# Patient Record
Sex: Female | Born: 1985 | Race: White | Hispanic: No | Marital: Married | Smoking: Never smoker
Health system: Southern US, Community
[De-identification: ages and names within clinical notes are randomized; demographics above are authoritative.]

## PROBLEM LIST (undated history)

## (undated) ENCOUNTER — Emergency Department: Discharge status: Absent without leave | Payer: 59

## (undated) HISTORY — PX: WISDOM TOOTH EXTRACTION: SHX21

---

## 2016-10-09 ENCOUNTER — Encounter: Payer: Self-pay | Admitting: Podiatry

## 2016-10-09 ENCOUNTER — Ambulatory Visit (INDEPENDENT_AMBULATORY_CARE_PROVIDER_SITE_OTHER): Payer: 59 | Admitting: Podiatry

## 2016-10-09 ENCOUNTER — Ambulatory Visit: Payer: 59

## 2016-10-09 VITALS — BP 133/70 | HR 88 | Resp 16

## 2016-10-09 DIAGNOSIS — S9031XA Contusion of right foot, initial encounter: Secondary | ICD-10-CM

## 2016-10-09 DIAGNOSIS — B353 Tinea pedis: Secondary | ICD-10-CM

## 2016-10-09 DIAGNOSIS — L603 Nail dystrophy: Secondary | ICD-10-CM | POA: Diagnosis not present

## 2016-10-09 NOTE — Progress Notes (Signed)
  Subjective:  Patient ID: Allison Jacobson, female    DOB: Jul 28, 1985,  MRN: [id number] HPI Chief Complaint  Patient presents with  . Skin Problem    Skin on right foot is scaly, peeling, has red splotches for years, moved from [place] a year ago and was Rx'd cream there, but didn't help, also toenails are thick and discolored    31 y.o. female presents with the above complaint. She presents today with her daughter chief complaint of red scaly itchy feet and thick mycotic nails. States the nails become thick and brittle about the time the rash came up. She states that she's had skin biopsies in the past that was never told the results was given a cream which did not help and then was told that she would have to come to the office for laser therapy.  She wanted a second opinion.  No past medical history on file. No past surgical history on file.  Current Outpatient Prescriptions:  .  cetirizine (ZYRTEC) 10 MG tablet, TAKE 1 TABLET BY MOUTH EVERY DAY AT NIGHT, Disp: , Rfl: 1 .  predniSONE (DELTASONE) 20 MG tablet, Take 40 mg by mouth daily., Disp: , Rfl: 0  No Known Allergies Review of Systems  Skin: Positive for color change and rash.  All other systems reviewed and are negative.  Objective:   Vitals:   10/09/16 1333  BP: 133/70  Pulse: 88  Resp: 16    General: Well developed, nourished, in no acute distress, alert and oriented x3   Dermatological: Mild erythematous scaly scalloped rash multiple vesicular lesions and mild punctated petechial lesions. Toenails are thick yellow dystrophic brittle possibly mycotic.  Vascular: Dorsalis Pedis artery and Posterior Tibial artery pedal pulses are 2/4 bilateral with immedate capillary fill time. Pedal hair growth present. No varicosities and no lower extremity edema present bilateral.   Neruologic: Grossly intact via light touch bilateral. Vibratory intact via tuning fork bilateral. Protective threshold with Semmes Wienstein monofilament  intact to all pedal sites bilateral. Patellar and Achilles deep tendon reflexes 2+ bilateral. No Babinski or clonus noted bilateral.   Musculoskeletal: No gross boney pedal deformities bilateral. No pain, crepitus, or limitation noted with foot and ankle range of motion bilateral. Muscular strength 5/5 in all groups tested bilateral.  Gait: Unassisted, Nonantalgic.    Radiographs:  None taken  Assessment & Plan:   Assessment: Probable tinea pedis with onychomycosis certain nail dystrophy and dermatitis.    Plan: Debrided nails and sent for pathologic evaluation skin and nails. This only affected the right foot. Follow up with her in 1 month.     Max T. [place], [place]

## 2016-11-08 ENCOUNTER — Ambulatory Visit: Payer: 59 | Admitting: Podiatry

## 2016-11-08 ENCOUNTER — Ambulatory Visit (INDEPENDENT_AMBULATORY_CARE_PROVIDER_SITE_OTHER): Payer: 59 | Admitting: Podiatry

## 2016-11-08 DIAGNOSIS — Z79899 Other long term (current) drug therapy: Secondary | ICD-10-CM | POA: Diagnosis not present

## 2016-11-08 MED ORDER — TERBINAFINE HCL 250 MG PO TABS: 250.0000 mg | ORAL_TABLET | Freq: Every day | ORAL | 0 refills | Status: DC

## 2016-11-08 NOTE — Progress Notes (Signed)
She presents today for follow-up of her Baker results. She states that she's been using eucalyptus and spear meant or oils  which may have helped at least the skin to some degree.  Objective: Pathology report does demonstrate onychomycosis.  Assessment onychomycosis.  Plan: Discussed topical therapy laser therapy and oral therapy. She will start oral therapy at this point. We discussed the pros and cons and use of this medication. She understands that she will have to have 2 sets of blood work.  We requested a profile immediately today and started her on 30 days worth of Lamisil 250 mg tablets 1 by mouth daily.

## 2016-11-09 ENCOUNTER — Telehealth: Payer: Self-pay | Admitting: Podiatry

## 2016-11-09 LAB — HEPATIC FUNCTION PANEL
ALBUMIN: 4.1 g/dL (ref 3.5–5.5)
ALT: 34 IU/L — ABNORMAL HIGH (ref 0–32)
AST: 17 IU/L (ref 0–40)
Alkaline Phosphatase: 77 IU/L (ref 39–117)
Bilirubin, Direct: 0.07 mg/dL (ref 0.00–0.40)
Total Protein: 7.3 g/dL (ref 6.0–8.5)

## 2016-11-09 NOTE — Telephone Encounter (Signed)
I was calling because I have questions about the medication that Dr. Al CorpusHyatt prescribed me yesterday. If you could, please call me back at [phone]. Thank you.

## 2016-11-09 NOTE — Telephone Encounter (Signed)
Left message for patient to call back  

## 2016-11-10 NOTE — Telephone Encounter (Signed)
This is Allison Jacobson calling back from where I missed your call. If you don't mind, please call me back at [phone] in regards to the medication I was prescribed yesterday. Thank you.

## 2016-11-10 NOTE — Telephone Encounter (Signed)
Returned patient call, she was concerned about taking Lamisil due to it being high risk medication.  I explained to her that this was the reason for getting her labs done to check liver functions and making sure they were normal.  I explained that he will continue to check her liver functions throughout course of treatment and if they come back abnormal, she would be taken off the medication.  She verbalized understanding and was thankful for the call back.

## 2016-11-13 ENCOUNTER — Telehealth: Payer: Self-pay | Admitting: *Deleted

## 2016-11-13 NOTE — Telephone Encounter (Signed)
-----   Message from Elinor ParkinsonMax T Hyatt, [place] sent at 11/11/2016  7:52 AM EDT ----- Blood work looks good. Follow-up in one month.

## 2016-11-13 NOTE — Telephone Encounter (Signed)
I informed pt of Dr. Geryl RankinsHyatt's review of results and orders. Pt asked if the appt she has scheduled 12/11/2016 is okay and I told her that was fine, the medication stated in he system for at least 2 weeks if she had to get labs.

## 2016-12-11 ENCOUNTER — Encounter: Payer: Self-pay | Admitting: Podiatry

## 2016-12-11 ENCOUNTER — Ambulatory Visit (INDEPENDENT_AMBULATORY_CARE_PROVIDER_SITE_OTHER): Payer: 59 | Admitting: Podiatry

## 2016-12-11 DIAGNOSIS — L603 Nail dystrophy: Secondary | ICD-10-CM

## 2016-12-11 DIAGNOSIS — Z79899 Other long term (current) drug therapy: Secondary | ICD-10-CM | POA: Diagnosis not present

## 2016-12-11 MED ORDER — TERBINAFINE HCL 250 MG PO TABS: 250.0000 mg | ORAL_TABLET | Freq: Every day | ORAL | 0 refills | Status: DC

## 2016-12-11 NOTE — Progress Notes (Signed)
She presents today after having taken her first 30 days of Lamisil. She denies fever chills rash or itching. Initially developed some nauseousness and some vomiting. She also goes on to say that she has some headaches recently with the nauseousness has ended. She feels that the headaches may be either the medication or from stress of the holidays.  Objective: Vital signs are stable alert and oriented 3. Complete resolution of tinea pedis. She already has some nail changes and that the nails appear to be clear more proximally.  Assessment: Resolution of tinea pedis onychomycosis resolving with the use of long-term therapy with Lamisil.  Plan: We are requesting a liver profile today. Should this come back abnormal we will notify her immediately. Otherwise she is to go ahead and have her prescription for 90 days filled and I will follow-up with her in 4 months.

## 2016-12-12 ENCOUNTER — Telehealth: Payer: Self-pay | Admitting: *Deleted

## 2016-12-12 LAB — HEPATIC FUNCTION PANEL
ALK PHOS: 72 IU/L (ref 39–117)
ALT: 23 IU/L (ref 0–32)
AST: 12 IU/L (ref 0–40)
Albumin: 3.9 g/dL (ref 3.5–5.5)
Bilirubin Total: 0.2 mg/dL (ref 0.0–1.2)
Bilirubin, Direct: 0.06 mg/dL (ref 0.00–0.40)
TOTAL PROTEIN: 6.7 g/dL (ref 6.0–8.5)

## 2016-12-12 NOTE — Telephone Encounter (Addendum)
-----   Message from Allison ParkinsonMax T Jacobson, [place] sent at 12/12/2016  7:06 AM EST ----- Blood  Work looks perfect and may continue with medication. I informed pt of Dr. Geryl RankinsHyatt's review of results and orders.

## 2017-02-12 ENCOUNTER — Encounter: Payer: Self-pay | Admitting: Internal Medicine

## 2017-02-12 ENCOUNTER — Ambulatory Visit (INDEPENDENT_AMBULATORY_CARE_PROVIDER_SITE_OTHER): Payer: 59 | Admitting: Internal Medicine

## 2017-02-12 VITALS — BP 130/92 | HR 101 | Temp 97.8°F | Wt 276.0 lb

## 2017-02-12 DIAGNOSIS — J0100 Acute maxillary sinusitis, unspecified: Secondary | ICD-10-CM

## 2017-02-12 MED ORDER — FLUTICASONE PROPIONATE 50 MCG/ACT NA SUSP: 2.0000 | Freq: Every day | NASAL | 6 refills | Status: DC

## 2017-02-12 MED ORDER — AMOXICILLIN-POT CLAVULANATE 875-125 MG PO TABS: 1.0000 | ORAL_TABLET | Freq: Two times a day (BID) | ORAL | 0 refills | Status: DC

## 2017-02-12 MED ORDER — AZITHROMYCIN 250 MG PO TABS: ORAL_TABLET | ORAL | 0 refills | Status: DC

## 2017-02-12 MED ORDER — CETIRIZINE HCL 10 MG PO TABS: 10.0000 mg | ORAL_TABLET | Freq: Every day | ORAL | 11 refills | Status: DC

## 2017-02-12 NOTE — Progress Notes (Signed)
HPI  Pt presents to the clinic today with c/o headache, nasal congestion, sore throat and cough. This started 3 days ago. She is blowing yellow/green mucous out of her nose. She denies difficulty swallowing. The cough is non productive. She denies fever, chills or body aches. She has tried Ibuprofen, salt water gargles and nasal saline with minimal relief. She has a history of allergies. She has had sick contacts.  Review of Systems    No past medical history on file.  No family history on file.  Social History   Socioeconomic History  . Marital status: Married    Spouse name: Not on file  . Number of children: Not on file  . Years of education: Not on file  . Highest education level: Not on file  Social Needs  . Financial resource strain: Not on file  . Food insecurity - worry: Not on file  . Food insecurity - inability: Not on file  . Transportation needs - medical: Not on file  . Transportation needs - non-medical: Not on file  Occupational History  . Not on file  Tobacco Use  . Smoking status: Never Smoker  . Smokeless tobacco: Never Used  Substance and Sexual Activity  . Alcohol use: Yes    Comment: occasional  . Drug use: Not on file  . Sexual activity: Not on file  Other Topics Concern  . Not on file  Social History Narrative  . Not on file    No Known Allergies   Constitutional: Positive headache. Denies fever or abrupt weight changes.  HEENT:  Positive e facial pain, nasal congestion and sore throat. Denies eye redness, ear pain, ringing in the ears, wax buildup, runny nose or bloody nose. Respiratory: Positive cough. Denies difficulty breathing or shortness of breath.  Cardiovascular: Denies chest pain, chest tightness, palpitations or swelling in the hands or feet.   No other specific complaints in a complete review of systems (except as listed in HPI above).  Objective:   BP (!) 130/92 (BP Location: Right Arm, Patient Position: Sitting, Cuff Size:  Normal)   Pulse (!) 101   Temp 97.8 F (36.6 C) (Oral)   Wt 276 lb (125.2 kg)   SpO2 98%   General: Appears her stated age, in NAD. Skin: Dry and intact. HEENT: Head: normal shape and size, maxillary sinus tenderness noted;Ears: Tm's gray and intact, normal light reflex; Nose: mucosa boggy and moist, septum midline; Throat/Mouth: + PND. Teeth present, mucosa erythematous and moist, no exudate noted, no lesions or ulcerations noted.  Neck:  No adenopathy noted.  Cardiovascular: Tachycardic with normal rhythm. S1,S2 noted.  No murmur, rubs or gallops noted.  Pulmonary/Chest: Normal effort and positive vesicular breath sounds. No respiratory distress. No wheezes, rales or ronchi noted.       Assessment & Plan:   Acute Maxillary Sinusitis  Can use a Neti Pot which can be purchased from your local drug store. eRx for Zyrtec and Flonase eRx for Augmentin BID for 10 days  RTC as needed or if symptoms persist. Nicki ReaperBAITY, REGINA, NP

## 2017-02-12 NOTE — Patient Instructions (Signed)

## 2017-02-14 ENCOUNTER — Ambulatory Visit: Payer: Self-pay | Admitting: *Deleted

## 2017-02-14 NOTE — Telephone Encounter (Signed)
I spoke with Pamala Hurry Baity NP and advised pt to only take the Augmentin now and put the z pak on hold; if pt condition does not improve will cb for further instructions. Pt also said at night especially has a terrible cough. Pt will speak with pharmacist to be sure OK to take with other meds but pt plans on trying Delsym that she has taken before for cough. If cough does not improve pt will cb.

## 2017-02-14 NOTE — Telephone Encounter (Signed)
   Answer Assessment - Initial Assessment Questions 1. SYMPTOMS: "Do you have any symptoms?"      No   2. SEVERITY: If symptoms are present, ask "Are they mild, moderate or severe?"       No  Protocols used: MEDICATION QUESTION CALL-A-AH

## 2017-02-14 NOTE — Telephone Encounter (Signed)
Pt  Was   Seen  2  Days ago   By  Nicki Reaperegina   Baity   And  Was  Rx  For     Sinus    Infection   Got  The  augmentin    flonase   Zyrtec     And  z   Pack      The     Encounter    Did not  Mention   z  Pack   But  It   Was   E  rx     [place]   [address]    [place]  [address] conference   With  Pt    - Rena spoke  With  [employer]  Beity   Pt  Was  Advised  Not  To take the  z  Pack    But to  Hold  On to  It

## 2017-02-16 NOTE — Telephone Encounter (Signed)
Pt is aware as instructed and expressed understanding 

## 2017-02-16 NOTE — Telephone Encounter (Signed)
Hold Lamisil while on abx. Make sure she is taking the Augmentin with food. Common side effect is nausea and GI upset. If she can not tolerate the Augmentin, she can stop it an take the Azithromycin.

## 2017-02-16 NOTE — Telephone Encounter (Signed)
Patient called in saying "since I started the Augmentin yesterday, I've been nauseated and my head is hurting real bad. I vomited today, so I haven't taken any medicine. I don't know if it's a combination of the antibiotic and the lamisil that I take, because the lamisil side effect is headache. I just need to know what to do." I advised Nicki ReaperRegina Jacobson will be notified and someone will be in touch with an answer to her questions, she verbalized understanding.

## 2017-03-26 ENCOUNTER — Encounter: Payer: Self-pay | Admitting: Internal Medicine

## 2017-03-26 ENCOUNTER — Other Ambulatory Visit (HOSPITAL_COMMUNITY)
Admission: RE | Admit: 2017-03-26 | Discharge: 2017-03-26 | Disposition: A | Discharge status: Home / Self Care | Payer: 59 | Source: Ambulatory Visit | Attending: Internal Medicine | Admitting: Internal Medicine

## 2017-03-26 ENCOUNTER — Ambulatory Visit (INDEPENDENT_AMBULATORY_CARE_PROVIDER_SITE_OTHER): Payer: 59 | Admitting: Internal Medicine

## 2017-03-26 VITALS — BP 132/82 | HR 89 | Temp 98.1°F | Ht 68.0 in | Wt 275.0 lb

## 2017-03-26 DIAGNOSIS — Z0001 Encounter for general adult medical examination with abnormal findings: Secondary | ICD-10-CM | POA: Diagnosis present

## 2017-03-26 DIAGNOSIS — J302 Other seasonal allergic rhinitis: Secondary | ICD-10-CM | POA: Diagnosis not present

## 2017-03-26 DIAGNOSIS — R030 Elevated blood-pressure reading, without diagnosis of hypertension: Secondary | ICD-10-CM | POA: Diagnosis not present

## 2017-03-26 DIAGNOSIS — Z124 Encounter for screening for malignant neoplasm of cervix: Secondary | ICD-10-CM | POA: Insufficient documentation

## 2017-03-26 LAB — COMPREHENSIVE METABOLIC PANEL
ALBUMIN: 4 g/dL (ref 3.5–5.2)
ALT: 24 U/L (ref 0–35)
AST: 11 U/L (ref 0–37)
Alkaline Phosphatase: 69 U/L (ref 39–117)
BUN: 14 mg/dL (ref 6–23)
CHLORIDE: 104 meq/L (ref 96–112)
CO2: 30 mEq/L (ref 19–32)
Calcium: 9.8 mg/dL (ref 8.4–10.5)
Creatinine, Ser: 0.76 mg/dL (ref 0.40–1.20)
GFR: 92.75 mL/min (ref >60.00)
Glucose, Bld: 94 mg/dL (ref 70–99)
POTASSIUM: 3.8 meq/L (ref 3.5–5.1)
SODIUM: 139 meq/L (ref 135–145)
Total Bilirubin: 0.3 mg/dL (ref 0.2–1.2)
Total Protein: 7.5 g/dL (ref 6.0–8.3)

## 2017-03-26 LAB — CBC
HEMATOCRIT: 40.2 % (ref 36.0–46.0)
Hemoglobin: 13.5 g/dL (ref 12.0–15.0)
MCHC: 33.6 g/dL (ref 30.0–36.0)
MCV: 83.1 fl (ref 78.0–100.0)
Platelets: 325 10*3/uL (ref 150.0–400.0)
RBC: 4.84 Mil/uL (ref 3.87–5.11)
RDW: 13.8 % (ref 11.5–15.5)
WBC: 8.8 10*3/uL (ref 4.0–10.5)

## 2017-03-26 LAB — LIPID PANEL
CHOLESTEROL: 192 mg/dL (ref 0–200)
HDL: 40.7 mg/dL (ref >39.00)
LDL Cholesterol: 130 mg/dL — ABNORMAL HIGH (ref 0–99)
NONHDL: 150.83
TRIGLYCERIDES: 102 mg/dL (ref 0.0–149.0)
Total CHOL/HDL Ratio: 5
VLDL: 20.4 mg/dL (ref 0.0–40.0)

## 2017-03-26 LAB — HEMOGLOBIN A1C: Hgb A1c MFr Bld: 5.1 % (ref 4.6–6.5)

## 2017-03-26 NOTE — Assessment & Plan Note (Signed)
Continue Zyrtec and Flonase as needed 

## 2017-03-26 NOTE — Progress Notes (Signed)
HPI  Pt presents to the clinic today to establish care. She has not had a PCP in many years. She would like her annual exam today.  Seasonal Allergies: Worse in the summer. She takes Flonase and Zyrtec as needed with good relief.   Elevated Blood Pressure: Her BP today is 132/82. She denies headache, dizziness or blurred vision. She has never been told that she has HTN, but reports she gets extremely nervous when she comes to the doctor. She does not monitor her blood pressures at home.  Flu: never Tetanus :2010 Pap Smear: 2010 Dentist: as needed  Diet: She does eat meat. She consumes a lot of fruits and veggies daily. She does ear fried foods. She drinks mostly Coke. Exercise: None  No past medical history on file.  Current Outpatient Medications  Medication Sig Dispense Refill  . cetirizine (ZYRTEC) 10 MG tablet Take 1 tablet (10 mg total) by mouth daily. 30 tablet 11  . fluticasone (FLONASE) 50 MCG/ACT nasal spray Place 2 sprays into both nostrils daily. (Patient taking differently: Place 2 sprays into both nostrils as needed. ) 16 g 6   No current facility-administered medications for this visit.     No Known Allergies  No family history on file.  Social History   Socioeconomic History  . Marital status: Married    Spouse name: Not on file  . Number of children: Not on file  . Years of education: Not on file  . Highest education level: Not on file  Social Needs  . Financial resource strain: Not on file  . Food insecurity - worry: Not on file  . Food insecurity - inability: Not on file  . Transportation needs - medical: Not on file  . Transportation needs - non-medical: Not on file  Occupational History  . Not on file  Tobacco Use  . Smoking status: Never Smoker  . Smokeless tobacco: Never Used  Substance and Sexual Activity  . Alcohol use: Yes    Comment: occasional  . Drug use: Not on file  . Sexual activity: Not on file  Other Topics Concern  . Not on file   Social History Narrative  . Not on file    ROS:  Constitutional: Denies fever, malaise, fatigue, headache or abrupt weight changes.  HEENT: Denies eye pain, eye redness, ear pain, ringing in the ears, wax buildup, runny nose, nasal congestion, bloody nose, or sore throat. Respiratory: Denies difficulty breathing, shortness of breath, cough or sputum production.   Cardiovascular: Denies chest pain, chest tightness, palpitations or swelling in the hands or feet.  Gastrointestinal: Denies abdominal pain, bloating, constipation, diarrhea or blood in the stool.  GU: Denies frequency, urgency, pain with urination, blood in urine, odor or discharge. Musculoskeletal: Denies decrease in range of motion, difficulty with gait, muscle pain or joint pain and swelling.  Skin: Denies redness, rashes, lesions or ulcercations.  Neurological: Denies dizziness, difficulty with memory, difficulty with speech or problems with balance and coordination.  Psych: Pt reports mild anxiety. Denies depression, SI/HI.  No other specific complaints in a complete review of systems (except as listed in HPI above).  PE:  BP 132/82   Pulse 89   Temp 98.1 F (36.7 C) (Oral)   Ht 5\' 8"  (1.727 m)   Wt 275 lb (124.7 kg)   LMP 03/15/2017   SpO2 98%   BMI 41.81 kg/m   Wt Readings from Last 3 Encounters:  03/26/17 275 lb (124.7 kg)  02/12/17 276 lb (125.2  kg)    General: Appears her stated age, obese in NAD. HEENT: Head: normal shape and size; Eyes: sclera white, no icterus, conjunctiva pink, PERRLA and EOMs intact; Ears: Tm's gray and intact, normal light reflex;Throat/Mouth: Teeth present, mucosa pink and moist, no lesions or ulcerations noted.  Neck: Neck supple, trachea midline. No masses, lumps or thyromegaly present.  Cardiovascular: Normal rate and rhythm. S1,S2 noted.  No murmur, rubs or gallops noted. No JVD or BLE edema.  Pulmonary/Chest: Normal effort and positive vesicular breath sounds. No respiratory  distress. No wheezes, rales or ronchi noted.  Abdomen: Soft and nontender. Normal bowel sounds, no bruits noted. No distention or masses noted. Liver, spleen and kidneys non palpable. Pelvic: Normal female anatomy. Cervix with some changes noted, small amount of this white discharge noted. No odor. Adnexa non palpable. Musculoskeletal:  Strength 5/5 BUE/BLE. No signs of joint swelling. No difficulty with gait.  Neurological: Alert and oriented. Cranial nerves II-XII grossly intact. Coordination normal.  Psychiatric: Mood and affect normal. Behavior is normal. Judgment and thought content normal.    Assessment and Plan:  Preventative Health Maintenance:  She declines flu and tetanus today Pap smear today, she declines STD screening Encouraged her to consume a balanced diet and exercise regimen Advised her to see a dentist annually Will check CBC, CMET, TSH, Lipid and A1C today  Elevated Blood Pressure:  Anxiety induced Discussed DASH diet and increase in aerobic exercise for weight loss No medication at this time, will monitor  RTC in 1 year, sooner if needed Nicki ReaperBAITY, Davionte Lusby, NP

## 2017-03-26 NOTE — Patient Instructions (Signed)

## 2017-03-26 NOTE — Addendum Note (Signed)
Addended by: Roena MaladyEVONTENNO, Harriette Tovey Y on: 03/26/2017 12:33 PM   Modules accepted: Orders

## 2017-03-27 LAB — TSH: TSH: 1.64 u[IU]/mL (ref 0.35–4.50)

## 2017-03-27 LAB — CYTOLOGY - PAP
Adequacy: ABSENT
Diagnosis: NEGATIVE

## 2017-04-11 ENCOUNTER — Encounter: Payer: Self-pay | Admitting: Podiatry

## 2017-04-11 ENCOUNTER — Ambulatory Visit (INDEPENDENT_AMBULATORY_CARE_PROVIDER_SITE_OTHER): Payer: 59 | Admitting: Podiatry

## 2017-04-11 DIAGNOSIS — L603 Nail dystrophy: Secondary | ICD-10-CM | POA: Diagnosis not present

## 2017-04-11 DIAGNOSIS — Z79899 Other long term (current) drug therapy: Secondary | ICD-10-CM | POA: Diagnosis not present

## 2017-04-11 MED ORDER — TERBINAFINE HCL 250 MG PO TABS: 250.0000 mg | ORAL_TABLET | Freq: Every day | ORAL | 0 refills | Status: DC

## 2017-04-11 NOTE — Progress Notes (Signed)
She presents today after having completed 120 days of Lamisil therapy and states I think my toes are starting to look a little better.  She denies any problems with medications.  Fever chills nausea vomiting muscle aches pains rashes or itching.  Objective: Vital signs are stable she is alert and oriented x3 warts nails her hallux toenail right.  It has started to clear by approximately one third at this point.  Assessment slowly healing onychomycosis with long-term therapy.  Plan: Continue the use of Lamisil 1 tablet every other day follow-up with me with questions or concerns.  Follow-up with her in 3 months

## 2017-04-11 NOTE — Patient Instructions (Signed)
Dr. Hyatt has sent over a refill for Lamisil to your pharmacy today. The instructions on your bottle will say "take 1 tablet daily", however, he would like for you to take one pill every other day. He will follow up with you in 3 months to re-evaluate your toenails. 

## 2017-06-13 ENCOUNTER — Encounter: Payer: Self-pay | Admitting: Internal Medicine

## 2017-06-13 NOTE — Telephone Encounter (Signed)
I called pt and pt had already read the pt email note with Pamala Hurry NP; I reviewed R baity NP instructions again and pt voiced understanding. Pt will cb if symptoms do not improve to schedule appt.

## 2017-06-13 NOTE — Telephone Encounter (Signed)
Copied from CRM [id number]. Topic: General - Other >> Jun 13, 2017  2:07 PM Percival Spanish wrote:  Pt call to say she was given AZITHROMYTIN back in January and did not have to take it. No she is said she is having sinus issues and is asking if it is ok for her to take the med  [phone]

## 2017-06-15 ENCOUNTER — Ambulatory Visit (INDEPENDENT_AMBULATORY_CARE_PROVIDER_SITE_OTHER): Payer: 59 | Admitting: Family Medicine

## 2017-06-15 ENCOUNTER — Encounter: Payer: Self-pay | Admitting: Family Medicine

## 2017-06-15 VITALS — BP 128/84 | HR 84 | Temp 97.8°F | Ht 68.0 in | Wt 278.0 lb

## 2017-06-15 DIAGNOSIS — J339 Nasal polyp, unspecified: Secondary | ICD-10-CM

## 2017-06-15 DIAGNOSIS — R0981 Nasal congestion: Secondary | ICD-10-CM | POA: Diagnosis not present

## 2017-06-15 MED ORDER — PSEUDOEPHEDRINE HCL 30 MG PO TABS: 30.0000 mg | ORAL_TABLET | ORAL | 1 refills | Status: DC | PRN

## 2017-06-15 MED ORDER — OXYMETAZOLINE HCL 0.05 % NA SOLN: 1.0000 | Freq: Two times a day (BID) | NASAL | 0 refills | Status: DC

## 2017-06-15 NOTE — Patient Instructions (Addendum)
Finish the antibiotic  I have sent nasal spray and decongestant to your pharmacy. You can also use saline nasal spray 3-4 times a day  Please stop at the desk for ENT referral

## 2017-06-15 NOTE — Progress Notes (Signed)
   Subjective:    Patient ID: Allison Jacobson, female    DOB: Feb 08, 1985, 32 y.o.   MRN: [id number]  HPI This is a 32 yo female who presents today with nasal congestion x 4 days.  Tried fluticasone and nose felt more blocked. Had an extra prescription of zpack and has taken 3 days. Ear pain at beginning that went away. Nose won't drain. No cough. No fever. Takes cetirizine daily.  Has been told she has nasal polyp in past, had ENT referral but her out of pocket cost to be seen was unaffordable. Has chronic sinus issues.   No past medical history on file. Past Surgical History:  Procedure Laterality Date  . WISDOM TOOTH EXTRACTION     Family History  Problem Relation Age of Onset  . Uterine cancer Mother   . Hypertension Father   . Diabetes Maternal Grandmother   . COPD Maternal Grandfather   . Diabetes Paternal Grandmother    Social History   Tobacco Use  . Smoking status: Never Smoker  . Smokeless tobacco: Never Used  Substance Use Topics  . Alcohol use: Yes    Comment: occasional  . Drug use: Not on file      Review of Systems Per HPI    Objective:   Physical Exam  Constitutional: She is oriented to person, place, and time. She appears well-developed and well-nourished. No distress.  HENT:  Head: Normocephalic and atraumatic.  Right Ear: Tympanic membrane, external ear and ear canal normal.  Left Ear: Tympanic membrane, external ear and ear canal normal.  Nose: Mucosal edema, rhinorrhea and nasal deformity (right nasal polyp) present.  Mouth/Throat: Uvula is midline. Posterior oropharyngeal erythema present.  Post nasal drainage. Sounds congested.   Eyes: Conjunctivae are normal.  Neck: Normal range of motion. Neck supple.  Cardiovascular: Normal rate, regular rhythm and normal heart sounds.  Pulmonary/Chest: Effort normal and breath sounds normal.  Lymphadenopathy:    She has no cervical adenopathy.  Neurological: She is alert and oriented to person, place, and  time.  Skin: Skin is warm and dry. She is not diaphoretic.  Psychiatric: She has a normal mood and affect. Her behavior is normal. Judgment and thought content normal.  Vitals reviewed.     BP 128/84 (BP Location: Right Arm, Patient Position: Sitting, Cuff Size: Normal)   Pulse 84   Temp 97.8 F (36.6 C) (Oral)   Ht  (1.727 m)   Wt 278 lb (126.1 kg)   LMP 06/01/2017   SpO2 98%   BMI 42.27 kg/m  Wt Readings from Last 3 Encounters:  06/15/17 278 lb (126.1 kg)  03/26/17 275 lb (124.7 kg)  02/12/17 276 lb (125.2 kg)       Assessment & Plan:  1. Nasal congestion - finish Zpack since she already started, continue cetirizine - pseudoephedrine (SUDAFED) 30 MG tablet; Take 1 tablet (30 mg total) by mouth every 4 (four) hours as needed for congestion.  Dispense: 30 tablet; Refill: 1 - oxymetazoline (AFRIN) 0.05 % nasal spray; Place 1 spray into both nostrils 2 (two) times daily.  Dispense: 30 mL; Refill: 0 - Ambulatory referral to ENT  2. Nasal polyp - Ambulatory referral to ENT   Olean Ree, FNP-BC  [facility], [facility]  06/19/2017 5:42 PM

## 2017-06-19 ENCOUNTER — Encounter: Payer: Self-pay | Admitting: Family Medicine

## 2017-07-18 ENCOUNTER — Ambulatory Visit (INDEPENDENT_AMBULATORY_CARE_PROVIDER_SITE_OTHER): Payer: 59 | Admitting: Podiatry

## 2017-07-18 ENCOUNTER — Encounter: Payer: Self-pay | Admitting: Podiatry

## 2017-07-18 DIAGNOSIS — Z79899 Other long term (current) drug therapy: Secondary | ICD-10-CM | POA: Diagnosis not present

## 2017-07-18 DIAGNOSIS — L603 Nail dystrophy: Secondary | ICD-10-CM | POA: Diagnosis not present

## 2017-07-18 MED ORDER — TERBINAFINE HCL 250 MG PO TABS: 250.0000 mg | ORAL_TABLET | Freq: Every day | ORAL | 0 refills | Status: DC

## 2017-07-18 NOTE — Patient Instructions (Signed)
Dr. Hyatt has sent over a refill for Lamisil to your pharmacy today. The instructions on your bottle will say "take 1 tablet daily", however, he would like for you to take one pill every other day. He will follow up with you in 3 months to re-evaluate your toenails. 

## 2017-07-18 NOTE — Progress Notes (Signed)
She presents today for follow-up of her nail fungus.  Is that they are doing much better everything is growing out very nicely she is happy with the outcome thus far.  Has no problems taking the medication.  Objective: Vital signs are stable alert and oriented x3.  Pulses are palpable.  Hallux right is the most severe nail plate involved and is about 75% grown out.  The remainder of the nail plates appear to be almost 100% grown out.  Assessment: Resolving onychomycosis secondary to long-term therapy.  Plan: Discussed etiology pathology conservative versus surgical therapies.  I highly recommend that she continue oral therapy.  She is will take 1 250 mg tablet of Lamisil every other day or every third day.  I will follow-up with her in 2 to 3 months.

## 2017-10-04 ENCOUNTER — Encounter: Payer: Self-pay | Admitting: Family Medicine

## 2017-10-04 ENCOUNTER — Ambulatory Visit (INDEPENDENT_AMBULATORY_CARE_PROVIDER_SITE_OTHER): Payer: 59 | Admitting: Family Medicine

## 2017-10-04 VITALS — BP 128/80 | HR 77 | Temp 98.3°F | Ht 68.0 in | Wt 276.2 lb

## 2017-10-04 DIAGNOSIS — J069 Acute upper respiratory infection, unspecified: Secondary | ICD-10-CM

## 2017-10-04 DIAGNOSIS — J029 Acute pharyngitis, unspecified: Secondary | ICD-10-CM

## 2017-10-04 LAB — POCT RAPID STREP A (OFFICE): Rapid Strep A Screen: NEGATIVE

## 2017-10-04 MED ORDER — IBUPROFEN 200 MG PO TABS: 400.0000 mg | ORAL_TABLET | Freq: Four times a day (QID) | ORAL | 1 refills | Status: DC | PRN

## 2017-10-04 MED ORDER — PHENOL 1.4 % MT LIQD
2.0000 | OROMUCOSAL | 0 refills | Status: DC | PRN
Start: 2017-10-04 — End: 2018-02-04

## 2017-10-04 NOTE — Assessment & Plan Note (Signed)
With ST from pnd - expect she will develop more nasal symptoms  Cough is mild  Neg rst  Disc symptomatic care - see instructions on AVS  Rest/fluids  Sent in px (for flex card) for chloraseptic and ibuprofen)  She has mucinex as home for prn use  Update if not starting to improve in a week or if worsening

## 2017-10-04 NOTE — Progress Notes (Signed)
Subjective:    Patient ID: Allison Jacobson, female    DOB: [birth year], 32 y.o.   MRN: [id number]  HPI Here for c/o ST 32 yo pt of NP Baity with a hx of seasonal allergies  Temp: 98.3 F (36.8 C)   Symptoms started yesterday  Tried some vics vapor rub  Woke up at 1:30 am - and it was very sore and felt swollen   No fever  No chills or aches  Has been tired   Has not taken any otc medicine   Takes allergy medicine Can feel mucous in her throat   No tick or insect bites  Coughed once or twice   No ear pain   Results for orders placed or performed in visit on 10/04/17  Rapid Strep A  Result Value Ref Range   Rapid Strep A Screen Negative Negative     Son -had respiratory illness last weekend   Patient Active Problem List   Diagnosis Date Noted  . Viral URI 10/04/2017  . Seasonal allergic rhinitis 03/26/2017   History reviewed. No pertinent past medical history. Past Surgical History:  Procedure Laterality Date  . WISDOM TOOTH EXTRACTION     Social History   Tobacco Use  . Smoking status: Never Smoker  . Smokeless tobacco: Never Used  Substance Use Topics  . Alcohol use: Not Currently  . Drug use: Never   Family History  Problem Relation Age of Onset  . Uterine cancer Mother   . Hypertension Father   . Diabetes Maternal Grandmother   . COPD Maternal Grandfather   . Diabetes Paternal Grandmother    No Known Allergies Current Outpatient Medications on File Prior to Visit  Medication Sig Dispense Refill  . cetirizine (ZYRTEC) 10 MG tablet Take 1 tablet (10 mg total) by mouth daily. 30 tablet 11  . fluticasone (FLONASE) 50 MCG/ACT nasal spray Place 2 sprays into both nostrils daily.    Marland Kitchen terbinafine (LAMISIL) 250 MG tablet Take 1 tablet (250 mg total) by mouth daily. (Patient taking differently: Take 250 mg by mouth every other day. ) 30 tablet 0   No current facility-administered medications on file prior to visit.     Review of Systems    Constitutional: Positive for fatigue. Negative for activity change, appetite change, chills, fever and unexpected weight change.  HENT: Positive for postnasal drip. Negative for congestion, ear pain, facial swelling, mouth sores, rhinorrhea, sinus pressure, sinus pain, sore throat, trouble swallowing and voice change.        Pain to swallow but no trouble swallowing   Eyes: Negative for pain, redness and visual disturbance.  Respiratory: Positive for cough. Negative for shortness of breath and wheezing.   Cardiovascular: Negative for chest pain and palpitations.  Gastrointestinal: Negative for abdominal pain, blood in stool, constipation and diarrhea.  Endocrine: Negative for polydipsia and polyuria.  Genitourinary: Negative for dysuria, frequency and urgency.  Musculoskeletal: Negative for arthralgias, back pain and myalgias.  Skin: Negative for pallor and rash.  Allergic/Immunologic: Negative for environmental allergies.  Neurological: Negative for dizziness, syncope and headaches.  Hematological: Negative for adenopathy. Does not bruise/bleed easily.  Psychiatric/Behavioral: Negative for decreased concentration and dysphoric mood. The patient is not nervous/anxious.        Objective:   Physical Exam  Constitutional: She appears well-developed and well-nourished.  Non-toxic appearance. She does not appear ill. No distress.  Well appearing obese female   HENT:  Head: Normocephalic and atraumatic.  Right Ear: Tympanic membrane normal.  No drainage or tenderness.  Left Ear: Tympanic membrane normal. No drainage or tenderness.  Mouth/Throat: Uvula is midline and mucous membranes are normal. No oral lesions. No uvula swelling. Posterior oropharyngeal erythema present. No oropharyngeal exudate, posterior oropharyngeal edema or tonsillar abscesses. Tonsils are 0 on the right. Tonsils are 0 on the left. No tonsillar exudate.  Eyes: Pupils are equal, round, and reactive to light. EOM are normal.   Neck: Normal range of motion.  Cardiovascular: Normal rate, regular rhythm and normal heart sounds.  Pulmonary/Chest: Effort normal and breath sounds normal. No stridor. No respiratory distress. She has no wheezes. She has no rhonchi. She has no rales. She exhibits no tenderness.  Lymphadenopathy:    She has no cervical adenopathy.  Neurological: She is alert.  Skin: Skin is warm and dry. No rash noted.  Psychiatric: She has a normal mood and affect.          Assessment & Plan:   Problem List Items Addressed This Visit      Respiratory   Viral URI - Primary    With ST from pnd - expect she will develop more nasal symptoms  Cough is mild  Neg rst  Disc symptomatic care - see instructions on AVS  Rest/fluids  Sent in px (for flex card) for chloraseptic and ibuprofen)  She has mucinex as home for prn use  Update if not starting to improve in a week or if worsening         Other Visit Diagnoses    Sore throat       Relevant Orders   Rapid Strep A (Completed)

## 2017-10-04 NOTE — Patient Instructions (Addendum)
Drink lots of fluids (cold)  Rest when you can   Strep test is negative   Chloraseptic throat spray is ok  For sore throat - ibuprofen (advil) or naproxen (aleve) over the counter as directed - this help pain and swelling (take with food or it will hurt your stomach)   Continue zyrtec and flonase  For cough- if needed I like mucinex DM or robitussin DM  Or thera flu    Update if not starting to improve in a week or if worsening

## 2017-10-15 ENCOUNTER — Ambulatory Visit (INDEPENDENT_AMBULATORY_CARE_PROVIDER_SITE_OTHER): Payer: 59 | Admitting: Podiatry

## 2017-10-15 ENCOUNTER — Encounter: Payer: Self-pay | Admitting: Podiatry

## 2017-10-15 DIAGNOSIS — L603 Nail dystrophy: Secondary | ICD-10-CM

## 2017-10-15 NOTE — Progress Notes (Signed)
She presents today for follow-up of her nail fungus states that I think them almost cleared up she states that she started on some amoxicillin and prednisone a couple weeks ago for sinus infection at which time she stopped the Lamisil because she was concerned about drug drug interactions.  Objective: Vital signs are stable she is alert and oriented x3.  Nail plates have almost resolved 100%.  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  Plan: At this point I highly recommended that she continue the Lamisil after completing the amoxicillin.  If this should completely grow out the remainder of her nail plate.  Should she have questions or concerns she will notify us immediately if she sees any recurrence she will notify us immediately.

## 2018-02-04 ENCOUNTER — Encounter: Payer: Self-pay | Admitting: Internal Medicine

## 2018-02-04 ENCOUNTER — Ambulatory Visit (INDEPENDENT_AMBULATORY_CARE_PROVIDER_SITE_OTHER): Payer: 59 | Admitting: Internal Medicine

## 2018-02-04 VITALS — BP 122/76 | HR 101 | Temp 99.1°F | Ht 68.0 in | Wt 275.0 lb

## 2018-02-04 DIAGNOSIS — R509 Fever, unspecified: Secondary | ICD-10-CM | POA: Diagnosis not present

## 2018-02-04 DIAGNOSIS — J101 Influenza due to other identified influenza virus with other respiratory manifestations: Secondary | ICD-10-CM | POA: Insufficient documentation

## 2018-02-04 LAB — POC INFLUENZA A&B (BINAX/QUICKVUE)
INFLUENZA A, POC: POSITIVE — AB
Influenza B, POC: NEGATIVE

## 2018-02-04 LAB — POCT RAPID STREP A (OFFICE): Rapid Strep A Screen: NEGATIVE

## 2018-02-04 MED ORDER — OSELTAMIVIR PHOSPHATE 75 MG PO CAPS: 75.0000 mg | ORAL_CAPSULE | Freq: Two times a day (BID) | ORAL | 0 refills | Status: DC

## 2018-02-04 NOTE — Assessment & Plan Note (Signed)
Mild case but only 24 hours Will give tamiflu She will contact doctors for the rest of her family about prophylaxis Supportive care

## 2018-02-04 NOTE — Progress Notes (Signed)
Subjective:    Patient ID: Allison Jacobson, female    DOB: May 24, 1985, 33 y.o.   MRN: [id number]  HPI Here due to respiratory infection--sore throat Here with daughter Started yesterday with sore throat---felt like sinus drainage Went home and tried mucinex Progressively worsened today Fever to 100.9 today Some chills without sweats No muscle aches  Some cough--- mucus is yellow. Little No SOB  No other meds  Current Outpatient Medications on File Prior to Visit  Medication Sig Dispense Refill  . cetirizine (ZYRTEC) 10 MG tablet Take 1 tablet (10 mg total) by mouth daily. 30 tablet 11  . fluticasone (FLONASE) 50 MCG/ACT nasal spray Place 2 sprays into both nostrils daily.    Marland Kitchen. ibuprofen (ADVIL) 200 MG tablet Take 2 tablets (400 mg total) by mouth every 6 (six) hours as needed for fever, headache, mild pain or moderate pain. With food 30 tablet 1   No current facility-administered medications on file prior to visit.     No Known Allergies  History reviewed. No pertinent past medical history.  Past Surgical History:  Procedure Laterality Date  . WISDOM TOOTH EXTRACTION      Family History  Problem Relation Age of Onset  . Uterine cancer Mother   . Hypertension Father   . Diabetes Maternal Grandmother   . COPD Maternal Grandfather   . Diabetes Paternal Grandmother     Social History   Socioeconomic History  . Marital status: Married    Spouse name: Not on file  . Number of children: Not on file  . Years of education: Not on file  . Highest education level: Not on file  Occupational History  . Not on file  Social Needs  . Financial resource strain: Not on file  . Food insecurity:    Worry: Not on file    Inability: Not on file  . Transportation needs:    Medical: Not on file    Non-medical: Not on file  Tobacco Use  . Smoking status: Never Smoker  . Smokeless tobacco: Never Used  Substance and Sexual Activity  . Alcohol use: Not Currently  . Drug  use: Never  . Sexual activity: Not on file  Lifestyle  . Physical activity:    Days per week: Not on file    Minutes per session: Not on file  . Stress: Not on file  Relationships  . Social connections:    Talks on phone: Not on file    Gets together: Not on file    Attends religious service: Not on file    Active member of club or organization: Not on file    Attends meetings of clubs or organizations: Not on file    Relationship status: Not on file  . Intimate partner violence:    Fear of current or ex partner: Not on file    Emotionally abused: Not on file    Physically abused: Not on file    Forced sexual activity: Not on file  Other Topics Concern  . Not on file  Social History Narrative  . Not on file   Review of Systems Continues on her allergy meds No rash No vomiting  Some loose stools Appetite is off--but able to eat    Objective:   Physical Exam  Constitutional: She appears well-developed.  HENT:  No sinus tenderness Moderate nasal swelling Mild pharyngeal injection  TMs normal  Neck: No thyromegaly present.  Respiratory: Effort normal and breath sounds normal. No respiratory distress. She  has no wheezes. She has no rales.  Lymphadenopathy:    She has no cervical adenopathy.           Assessment & Plan:

## 2018-02-15 ENCOUNTER — Other Ambulatory Visit: Payer: Self-pay | Admitting: Internal Medicine

## 2018-02-15 ENCOUNTER — Telehealth: Payer: Self-pay

## 2018-02-15 ENCOUNTER — Ambulatory Visit (INDEPENDENT_AMBULATORY_CARE_PROVIDER_SITE_OTHER): Payer: 59 | Admitting: Family Medicine

## 2018-02-15 ENCOUNTER — Encounter: Payer: Self-pay | Admitting: Family Medicine

## 2018-02-15 DIAGNOSIS — R05 Cough: Secondary | ICD-10-CM

## 2018-02-15 DIAGNOSIS — R059 Cough, unspecified: Secondary | ICD-10-CM

## 2018-02-15 MED ORDER — CETIRIZINE HCL 10 MG PO TABS: 10.0000 mg | ORAL_TABLET | Freq: Every day | ORAL | 11 refills | Status: DC

## 2018-02-15 MED ORDER — HYDROCODONE-HOMATROPINE 5-1.5 MG/5ML PO SYRP: 5.0000 mL | ORAL_SOLUTION | Freq: Three times a day (TID) | ORAL | 0 refills | Status: DC | PRN

## 2018-02-15 MED ORDER — ALBUTEROL SULFATE HFA 108 (90 BASE) MCG/ACT IN AERS: 1.0000 | INHALATION_SPRAY | Freq: Four times a day (QID) | RESPIRATORY_TRACT | 1 refills | Status: DC | PRN

## 2018-02-15 NOTE — Patient Instructions (Signed)
Use albuterol inhaler for the cough.  Use hycodan if needed, sedation caution.  Rest and fluids.  Rest your voice.  Out of work for now.  Update us as needed.  Take care.  Glad to see you.

## 2018-02-15 NOTE — Telephone Encounter (Signed)
Patient Name: Allison Jacobson Gender: Female DOB: 1985-03-30 Age: 33 Y 7 M 8 D Return Phone Number: [phone] (Primary) Address: [place]: [place] [id number] Client [facility] - Client Client [facility] Night Physician Nicki ReaperBaity, Regina - NP Contact Type Call Who Is Calling Patient / Member / Family / Caregiver Call Type Triage / Clinical Relationship To Patient Self Return Phone Number [phone] (Primary) Chief Complaint Cough Reason for Call Symptomatic / Request for Health Information Initial Comment Caller states she was dx with Type A flu last Monday. Caller states she has a cough now. Caller has tried Delsym, cough drops, and NyQuil but still no relief. Translation No Nurse Assessment Nurse: Fredric MareBailey, RN, Misty StanleyLisa Date/Time Lamount Cohen(Eastern Time): 02/14/2018 5:13:29 PM Confirm and document reason for call. If symptomatic, describe symptoms. ---Caller states she was dx with Type A flu last Monday. Caller states she has a cough now. Caller has tried Delsym, cough drops, and NyQuil but still no relief. States that the cough is so bad that it makes her head hurt. Non productive cough. No fever or other symptoms. Does the patient have any new or worsening symptoms? ---Yes Will a triage be completed? ---Yes Related visit to physician within the last 2 weeks? ---Yes Does the PT have any chronic conditions? (i.e. diabetes, asthma, this includes High risk factors for pregnancy, etc.) ---No Is the patient pregnant or possibly pregnant? (Ask all females between the ages of 8212-55) ---No Is this a behavioral health or substance abuse call? ---No Guidelines Guideline Title Affirmed Question Affirmed Notes Nurse Date/Time (Eastern Time) Cough - Acute Non- Productive [1] Continuous (nonstop) coughing interferes with work or school AND [2] no improvement using cough treatment per protocol Fredric MareBailey, RN, Misty StanleyLisa 02/14/2018  5:16:51 PM Disp. Time Lamount Cohen(Eastern Time) Disposition Final User PLEASE NOTE: All timestamps contained within this report are represented as [place] Standard Time. CONFIDENTIALTY NOTICE: This fax transmission is intended only for the addressee. It contains information that is legally privileged, confidential or otherwise protected from use or disclosure. If you are not the intended recipient, you are strictly prohibited from reviewing, disclosing, copying using or disseminating any of this information or taking any action in reliance on or regarding this information. If you have received this fax in error, please notify us immediately by telephone so that we can arrange for its return to us. Phone: [phone], Toll-Free: [phone], Fax: [phone] Page: 2 of 2 Call Id: [id number] 02/14/2018 5:25:00 PM See PCP within 24 Hours Yes Fredric MareBailey, RN, Gracy RacerLisa Caller Disagree/Comply Comply Caller Understands Yes PreDisposition Did not know what to do Care Advice Given Per Guideline SEE PCP WITHIN 24 HOURS: * IF OFFICE WILL BE OPEN: You need to be seen within the next 24 hours. Call your doctor (or NP/PA) when the office opens and make an appointment. * IF OFFICE WILL BE CLOSED AND PCP SECOND-LEVEL TRIAGE REQUIRED: You may need to be seen within the next 24 hours. Your doctor (or NP/PA) will want to talk with you to decide what's best. I'll page the on-call provider now. NOTE: Since this isn't serious, hold the page between 10 pm and 7 am. Page the on-call provider in the morning. * HOME REMEDY - HONEY: This old home remedy has been shown to help decrease coughing at night. The adult dosage is 2 teaspoons (10 ml) at bedtime. Honey should not be given to infants under one year of age. HUMIDIFIER: If the air is dry, use a humidifier  in the bedroom. (Reason: dry air makes coughs worse) COUGHING SPELLS: CALL BACK IF: * Difficulty breathing occurs * You become worse. CARE ADVICE given per Cough - Acute Non-Productive  (Adult) guideline. Comments User: Beryle Lathe, RN Date/Time Lamount Cohen Time): 02/14/2018 5:27:15 PM Advised caller of care advice and to f/u with PCP in the morning. Pt v/u. Referrals REFERRED TO PCP OFFICE

## 2018-02-15 NOTE — Telephone Encounter (Signed)
Spoke with patient around 915am this morning and scheduled her to see Dr. Para March today at 1245.   Patient aware.

## 2018-02-15 NOTE — Progress Notes (Signed)
Prev with flu.  That all got better except for the cough and her voice is still hoarse.  Tried OTC med, cough drops, honey and lemon, none helped.  Still with cough.  No FCNAVD.   Some sputum, usually clear, occ scant yellow. Post tussive emesis.    Per HPI unless specifically indicated in ROS section   Meds, vitals, and allergies reviewed.   GEN: nad, alert and oriented HEENT: mucous membranes moist, TM w/o erythema, nasal epithelium injected, OP with cobblestoning NECK: supple w/o LA CV: rrr. PULM: ctab, no inc wob ABD: soft, +bs EXT: no edema Cough noted.

## 2018-02-17 DIAGNOSIS — R059 Cough, unspecified: Secondary | ICD-10-CM | POA: Insufficient documentation

## 2018-02-17 DIAGNOSIS — R05 Cough: Secondary | ICD-10-CM | POA: Insufficient documentation

## 2018-02-17 NOTE — Assessment & Plan Note (Signed)
Likely a post infectious cough.  D/w pt.  Ctab.  Still okay for outpatient f/u.  Use albuterol inhaler prn.  Use hycodan if needed, sedation caution.  Rest and fluids.  Rest voice.  Out of work for now.  Update [place] as needed.  She agrees with plan.

## 2018-03-21 ENCOUNTER — Encounter: Payer: 59 | Admitting: Internal Medicine

## 2018-04-18 ENCOUNTER — Other Ambulatory Visit: Payer: Self-pay | Admitting: Internal Medicine

## 2018-04-21 ENCOUNTER — Encounter: Payer: Self-pay | Admitting: Podiatry

## 2018-04-22 ENCOUNTER — Telehealth: Payer: Self-pay | Admitting: *Deleted

## 2018-04-22 NOTE — Telephone Encounter (Signed)
Pt wrote MyChart note stating her left toenail is growing like the right toenail and she wanted to know if she needed an appt.

## 2018-04-23 NOTE — Telephone Encounter (Signed)
Is it infected or ingrown.  We ar only supposed to take urgent conditions at this point.

## 2018-04-23 NOTE — Telephone Encounter (Signed)
I called pt states it is not an ingrown, she remembers hitting it on her little girl's bed and it is lifted up. Pt states she trimmed of the loose edge and it quit hurting, but could squeeze stuff from under the nail. I told pt if it was not bleeding and painful and there was not broken skin, to perform epsom salt soaks for about a week to dry up the stuff from under the toenail and to cover with a dry bandaid to protect from being pulled further and clip edge as grows out to protect from being torn off, and to call with concerns.

## 2018-05-02 ENCOUNTER — Encounter: Payer: 59 | Admitting: Internal Medicine

## 2018-06-02 ENCOUNTER — Encounter: Payer: Self-pay | Admitting: Internal Medicine

## 2018-06-03 ENCOUNTER — Encounter: Payer: Self-pay | Admitting: Internal Medicine

## 2018-06-03 ENCOUNTER — Ambulatory Visit (INDEPENDENT_AMBULATORY_CARE_PROVIDER_SITE_OTHER): Payer: 59 | Admitting: Internal Medicine

## 2018-06-03 DIAGNOSIS — W19XXXA Unspecified fall, initial encounter: Secondary | ICD-10-CM | POA: Diagnosis not present

## 2018-06-03 DIAGNOSIS — L231 Allergic contact dermatitis due to adhesives: Secondary | ICD-10-CM

## 2018-06-03 DIAGNOSIS — S60512A Abrasion of left hand, initial encounter: Secondary | ICD-10-CM

## 2018-06-03 DIAGNOSIS — S80212A Abrasion, left knee, initial encounter: Secondary | ICD-10-CM | POA: Diagnosis not present

## 2018-06-03 MED ORDER — CEPHALEXIN 500 MG PO CAPS: 500.0000 mg | ORAL_CAPSULE | Freq: Three times a day (TID) | ORAL | 0 refills | Status: DC

## 2018-06-03 MED ORDER — PREDNISONE 10 MG PO TABS: ORAL_TABLET | ORAL | 0 refills | Status: DC

## 2018-06-03 NOTE — Progress Notes (Signed)
Virtual Visit via Video Note  I connected with Allison Jacobson on 06/03/18 at 11:30 AM EDT by a video enabled telemedicine application and verified that I am speaking with the correct person using two identifiers.  Location: Patient: Work Provider: Office   I discussed the limitations of evaluation and management by telemedicine and the availability of in person appointments. The patient expressed understanding and agreed to proceed.  History of Present Illness:  Pt reports fall 10 days ago. She fell on her left hand and left knee. She sustained abrasion to left dorsal hand and left knee. She has noticed yellow drainage from the abrasion on her hand, no drainage from the knee. She reports a rash around the abrasion on the hand and the knee. She thinks she is having an allergic reaction to either the bandaids or Neosporin. She denies fever, chills or body aches.    History reviewed. No pertinent past medical history.  Current Outpatient Medications  Medication Sig Dispense Refill  . albuterol (PROVENTIL HFA;VENTOLIN HFA) 108 (90 Base) MCG/ACT inhaler Inhale 1-2 puffs into the lungs every 6 (six) hours as needed (for cough). Fill with ventolin/albuterol/proair 1 Inhaler 1  . cetirizine (ZYRTEC) 10 MG tablet Take 1 tablet (10 mg total) by mouth daily. 30 tablet 11  . fluticasone (FLONASE) 50 MCG/ACT nasal spray SPRAY 2 SPRAYS INTO EACH NOSTRIL EVERY DAY 16 g 1  . HYDROcodone-homatropine (HYCODAN) 5-1.5 MG/5ML syrup Take 5 mLs by mouth every 8 (eight) hours as needed for cough. Sedation caution 75 mL 0  . ibuprofen (ADVIL) 200 MG tablet Take 2 tablets (400 mg total) by mouth every 6 (six) hours as needed for fever, headache, mild pain or moderate pain. With food 30 tablet 1   No current facility-administered medications for this visit.     No Known Allergies  Family History  Problem Relation Age of Onset  . Uterine cancer Mother   . Hypertension Father   . Diabetes Maternal Grandmother    . COPD Maternal Grandfather   . Diabetes Paternal Grandmother     Social History   Socioeconomic History  . Marital status: Married    Spouse name: Not on file  . Number of children: Not on file  . Years of education: Not on file  . Highest education level: Not on file  Occupational History  . Not on file  Social Needs  . Financial resource strain: Not on file  . Food insecurity:    Worry: Not on file    Inability: Not on file  . Transportation needs:    Medical: Not on file    Non-medical: Not on file  Tobacco Use  . Smoking status: Never Smoker  . Smokeless tobacco: Never Used  Substance and Sexual Activity  . Alcohol use: Not Currently  . Drug use: Never  . Sexual activity: Not on file  Lifestyle  . Physical activity:    Days per week: Not on file    Minutes per session: Not on file  . Stress: Not on file  Relationships  . Social connections:    Talks on phone: Not on file    Gets together: Not on file    Attends religious service: Not on file    Active member of club or organization: Not on file    Attends meetings of clubs or organizations: Not on file    Relationship status: Not on file  . Intimate partner violence:    Fear of current or ex partner: Not  on file    Emotionally abused: Not on file    Physically abused: Not on file    Forced sexual activity: Not on file  Other Topics Concern  . Not on file  Social History Narrative  . Not on file     Constitutional: Denies fever, malaise, fatigue, headache or abrupt weight changes.  Skin: Pt reports abrasion to left hand, left knee, bruising of left lower leg.   No other specific complaints in a complete review of systems (except as listed in HPI above).   Wt Readings from Last 3 Encounters:  02/04/18 275 lb (124.7 kg)  10/04/17 276 lb 4 oz (125.3 kg)  06/15/17 278 lb (126.1 kg)    General: Appears her stated age, obese, in NAD. Skin: abrasion noted of left dorsal hand, pus noted, red papules  surrounding abrasion. Abrasion noted of left knee, no drainage. Bruising noted of left lower leg. Musculoskeletal: N No difficulty with gait.  Neurological: Alert and oriented.    BMET    Component Value Date/Time   NA 139 03/26/2017 1121   K 3.8 03/26/2017 1121   CL 104 03/26/2017 1121   CO2 30 03/26/2017 1121   GLUCOSE 94 03/26/2017 1121   BUN 14 03/26/2017 1121   CREATININE 0.76 03/26/2017 1121   CALCIUM 9.8 03/26/2017 1121    Lipid Panel     Component Value Date/Time   CHOL 192 03/26/2017 1121   TRIG 102.0 03/26/2017 1121   HDL 40.70 03/26/2017 1121   CHOLHDL 5 03/26/2017 1121   VLDL 20.4 03/26/2017 1121   LDLCALC 130 (H) 03/26/2017 1121    CBC    Component Value Date/Time   WBC 8.8 03/26/2017 1121   RBC 4.84 03/26/2017 1121   HGB 13.5 03/26/2017 1121   HCT 40.2 03/26/2017 1121   PLT 325.0 03/26/2017 1121   MCV 83.1 03/26/2017 1121   MCHC 33.6 03/26/2017 1121   RDW 13.8 03/26/2017 1121    Hgb A1C Lab Results  Component Value Date   HGBA1C 5.1 03/26/2017         Assessment and Plan:  Abrasion of Left Hand, Abrasion of Left Knee, Fall, Allergic Reaction to Adhesive:  Encouraged her to wash wounds with warm water and soap. Stop Neosporin and Bandaid use RX for Pred taper for allergic reaction RX for Keflex 500 mg TID x 7 days  Return precautions discussed  Follow Up Instructions:    I discussed the assessment and treatment plan with the patient. The patient was provided an opportunity to ask questions and all were answered. The patient agreed with the plan and demonstrated an understanding of the instructions.   The patient was advised to call back or seek an in-person evaluation if the symptoms worsen or if the condition fails to improve as anticipated.     Nicki Reaperegina Valincia Touch, NP

## 2018-06-03 NOTE — Patient Instructions (Signed)
Abrasion    An abrasion is a cut or a scrape on the surface of your skin. An abrasion does not go through all the layers of your skin. It is important to take good care of your abrasion to prevent infection.  Follow these instructions at home:  Medicines  · Take or apply over-the-counter and prescription medicines only as told by your doctor.  · If you were prescribed an antibiotic medicine, apply it as told by your doctor. Do not stop using the antibiotic even if you start to feel better.  Wound care  · Clean the wound 2-3 times a day or as often as told by your doctor. To do this:  ? Wash the wound with mild soap and water.  ? Rinse off the soap.  ? Pat a clean towel on the wound to dry it. Do not rub it.  · Keep the bandage (dressing) clean and dry as told by your doctor.  · Follow instructions from your doctor about how to take care of your wound. Make sure you:  ? Wash your hands with soap and water before you change your bandage. If you cannot use soap and water, use hand sanitizer.  ? Change your bandage as told by your doctor.  · Check your wound every day for signs of infection. Check for:  ? Redness, swelling, or pain.  ? Fluid or blood.  ? Warmth.  ? Pus or a bad smell.  · If directed, put ice on the injured area. To do this:  ? Put ice in a plastic bag.  ? Place a towel between your skin and the bag.  ? Leave the ice on for 20 minutes, 2-3 times a day.  General instructions  · Do not take baths, swim, or use a hot tub until your doctor says it is okay.  · If there is swelling, raise (elevate) the injured area above the level of your heart while you are sitting or lying down.  · Keep all follow-up visits as told by your doctor. This is important.  Contact a doctor if:  · You were given a tetanus shot, and you have any of these where the needle went in:  ? Swelling.  ? Very bad pain.  ? Redness.  ? Bleeding.  · You have a lot of pain, and medicine does not help.  · You have any of these at the site of the  wound:  ? More redness.  ? More swelling.  ? More pain.  Get help right away if:  · You have a red streak going away from your wound.  · You have a fever.  · You have fluid, blood, or pus coming from your wound.  · There is a bad smell coming from your wound or bandage.  Summary  · An abrasion is a cut or a scrape on the surface of your skin.  · Take good care of your abrasion so it does not get infected.  · Clean the wound with mild soap and water, and change your bandage as told by your doctor.  · Call your doctor if you have redness, swelling, or more pain in your wound.  · Get help right away if you have a fever or if you have fluid, blood, pus, a bad smell, or a red streak coming from the wound.  This information is not intended to replace advice given to you by your health care provider. Make sure you discuss any questions   you have with your health care provider.  Document Released: 06/21/2007 Document Revised: 08/24/2016 Document Reviewed: 08/24/2016  Elsevier Interactive Patient Education © 2019 Elsevier Inc.

## 2018-07-09 ENCOUNTER — Other Ambulatory Visit: Payer: Self-pay | Admitting: Internal Medicine

## 2018-07-23 ENCOUNTER — Encounter: Payer: 59 | Admitting: Internal Medicine

## 2018-08-05 ENCOUNTER — Encounter: Payer: Self-pay | Admitting: Internal Medicine

## 2018-08-05 ENCOUNTER — Other Ambulatory Visit: Payer: Self-pay

## 2018-08-05 ENCOUNTER — Telehealth: Payer: Self-pay

## 2018-08-05 ENCOUNTER — Ambulatory Visit (INDEPENDENT_AMBULATORY_CARE_PROVIDER_SITE_OTHER): Payer: 59 | Admitting: Internal Medicine

## 2018-08-05 VITALS — BP 128/80 | HR 87 | Temp 98.4°F | Ht 68.0 in | Wt 271.0 lb

## 2018-08-05 DIAGNOSIS — Z0000 Encounter for general adult medical examination without abnormal findings: Secondary | ICD-10-CM | POA: Diagnosis not present

## 2018-08-05 DIAGNOSIS — Z23 Encounter for immunization: Secondary | ICD-10-CM

## 2018-08-05 LAB — COMPREHENSIVE METABOLIC PANEL
ALT: 24 U/L (ref 0–35)
AST: 14 U/L (ref 0–37)
Albumin: 3.9 g/dL (ref 3.5–5.2)
Alkaline Phosphatase: 60 U/L (ref 39–117)
BUN: 13 mg/dL (ref 6–23)
CO2: 28 mEq/L (ref 19–32)
Calcium: 8.8 mg/dL (ref 8.4–10.5)
Chloride: 106 mEq/L (ref 96–112)
Creatinine, Ser: 0.77 mg/dL (ref 0.40–1.20)
GFR: 85.27 mL/min (ref >60.00)
Glucose, Bld: 99 mg/dL (ref 70–99)
Potassium: 4.1 mEq/L (ref 3.5–5.1)
Sodium: 138 mEq/L (ref 135–145)
Total Bilirubin: 0.3 mg/dL (ref 0.2–1.2)
Total Protein: 6.7 g/dL (ref 6.0–8.3)

## 2018-08-05 LAB — LIPID PANEL
Cholesterol: 181 mg/dL (ref 0–200)
HDL: 34.1 mg/dL — ABNORMAL LOW (ref >39.00)
LDL Cholesterol: 125 mg/dL — ABNORMAL HIGH (ref 0–99)
NonHDL: 147.1
Total CHOL/HDL Ratio: 5
Triglycerides: 109 mg/dL (ref 0.0–149.0)
VLDL: 21.8 mg/dL (ref 0.0–40.0)

## 2018-08-05 LAB — CBC
HCT: 39.1 % (ref 36.0–46.0)
Hemoglobin: 12.6 g/dL (ref 12.0–15.0)
MCHC: 32.2 g/dL (ref 30.0–36.0)
MCV: 84.8 fl (ref 78.0–100.0)
Platelets: 271 10*3/uL (ref 150.0–400.0)
RBC: 4.61 Mil/uL (ref 3.87–5.11)
RDW: 14 % (ref 11.5–15.5)
WBC: 7.2 10*3/uL (ref 4.0–10.5)

## 2018-08-05 LAB — HEMOGLOBIN A1C: Hgb A1c MFr Bld: 5.2 % (ref 4.6–6.5)

## 2018-08-05 NOTE — Patient Instructions (Signed)
Health Maintenance, Female Adopting a healthy lifestyle and getting preventive care are important in promoting health and wellness. Ask your health care provider about:  The right schedule for you to have regular tests and exams.  Things you can do on your own to prevent diseases and keep yourself healthy. What should I know about diet, weight, and exercise? Eat a healthy diet   Eat a diet that includes plenty of vegetables, fruits, low-fat dairy products, and lean protein.  Do not eat a lot of foods that are high in solid fats, added sugars, or sodium. Maintain a healthy weight Body mass index (BMI) is used to identify weight problems. It estimates body fat based on height and weight. Your health care provider can help determine your BMI and help you achieve or maintain a healthy weight. Get regular exercise Get regular exercise. This is one of the most important things you can do for your health. Most adults should:  Exercise for at least 150 minutes each week. The exercise should increase your heart rate and make you sweat (moderate-intensity exercise).  Do strengthening exercises at least twice a week. This is in addition to the moderate-intensity exercise.  Spend less time sitting. Even light physical activity can be beneficial. Watch cholesterol and blood lipids Have your blood tested for lipids and cholesterol at 35 years of age, then have this test every 5 years. Have your cholesterol levels checked more often if:  Your lipid or cholesterol levels are high.  You are older than 35 years of age.  You are at high risk for heart disease. What should I know about cancer screening? Depending on your health history and family history, you may need to have cancer screening at various ages. This may include screening for:  Breast cancer.  Cervical cancer.  Colorectal cancer.  Skin cancer.  Lung cancer. What should I know about heart disease, diabetes, and high blood  pressure? Blood pressure and heart disease  High blood pressure causes heart disease and increases the risk of stroke. This is more likely to develop in people who have high blood pressure readings, are of African descent, or are overweight.  Have your blood pressure checked: ? Every 3-5 years if you are 18-39 years of age. ? Every year if you are 40 years old or older. Diabetes Have regular diabetes screenings. This checks your fasting blood sugar level. Have the screening done:  Once every three years after age 40 if you are at a normal weight and have a low risk for diabetes.  More often and at a younger age if you are overweight or have a high risk for diabetes. What should I know about preventing infection? Hepatitis B If you have a higher risk for hepatitis B, you should be screened for this virus. Talk with your health care provider to find out if you are at risk for hepatitis B infection. Hepatitis C Testing is recommended for:  Everyone born from 1945 through 1965.  Anyone with known risk factors for hepatitis C. Sexually transmitted infections (STIs)  Get screened for STIs, including gonorrhea and chlamydia, if: ? You are sexually active and are younger than 35 years of age. ? You are older than 35 years of age and your health care provider tells you that you are at risk for this type of infection. ? Your sexual activity has changed since you were last screened, and you are at increased risk for chlamydia or gonorrhea. Ask your health care provider if   you are at risk.  Ask your health care provider about whether you are at high risk for HIV. Your health care provider may recommend a prescription medicine to help prevent HIV infection. If you choose to take medicine to prevent HIV, you should first get tested for HIV. You should then be tested every 3 months for as long as you are taking the medicine. Pregnancy  If you are about to stop having your period (premenopausal) and  you may become pregnant, seek counseling before you get pregnant.  Take 400 to 800 micrograms (mcg) of folic acid every day if you become pregnant.  Ask for birth control (contraception) if you want to prevent pregnancy. Osteoporosis and menopause Osteoporosis is a disease in which the bones lose minerals and strength with aging. This can result in bone fractures. If you are 65 years old or older, or if you are at risk for osteoporosis and fractures, ask your health care provider if you should:  Be screened for bone loss.  Take a calcium or vitamin D supplement to lower your risk of fractures.  Be given hormone replacement therapy (HRT) to treat symptoms of menopause. Follow these instructions at home: Lifestyle  Do not use any products that contain nicotine or tobacco, such as cigarettes, e-cigarettes, and chewing tobacco. If you need help quitting, ask your health care provider.  Do not use street drugs.  Do not share needles.  Ask your health care provider for help if you need support or information about quitting drugs. Alcohol use  Do not drink alcohol if: ? Your health care provider tells you not to drink. ? You are pregnant, may be pregnant, or are planning to become pregnant.  If you drink alcohol: ? Limit how much you use to 0-1 drink a day. ? Limit intake if you are breastfeeding.  Be aware of how much alcohol is in your drink. In the [place] one drink equals one 12 oz bottle of beer (355 mL), one 5 oz glass of wine (148 mL), or one 1 oz glass of hard liquor (44 mL). General instructions  Schedule regular health, dental, and eye exams.  Stay current with your vaccines.  Tell your health care provider if: ? You often feel depressed. ? You have ever been abused or do not feel safe at home. Summary  Adopting a healthy lifestyle and getting preventive care are important in promoting health and wellness.  Follow your health care provider's instructions about healthy  diet, exercising, and getting tested or screened for diseases.  Follow your health care provider's instructions on monitoring your cholesterol and blood pressure. This information is not intended to replace advice given to you by your health care provider. Make sure you discuss any questions you have with your health care provider. Document Released: 07/18/2010 Document Revised: 12/26/2017 Document Reviewed: 12/26/2017 Elsevier Patient Education  2020 Elsevier Inc.  

## 2018-08-05 NOTE — Telephone Encounter (Signed)
Records requested from [facility] for Pap fax [phone]

## 2018-08-05 NOTE — Progress Notes (Signed)
Subjective:    Patient ID: Allison Jacobson, female    DOB: 10/21/1985, 33 y.o.   MRN: [id number]  HPI  Patient presents to the clinic today for her annual wellness examination.  Flu: never Tetanus:  2010- due Pap Smear: 03/2017 Dentist: biannually Eye doctor: as needed  Diet:  She does eat meat, fruits, and vegetables.  Started working on a nutritional diet plan recently through [employer].  Drinks mostly water, gatorade, and regular soda. Exercise:  She does not exercise much aside from work.  Review of Systems      No past medical history on file.  Current Outpatient Medications  Medication Sig Dispense Refill  . albuterol (PROVENTIL HFA;VENTOLIN HFA) 108 (90 Base) MCG/ACT inhaler Inhale 1-2 puffs into the lungs every 6 (six) hours as needed (for cough). Fill with ventolin/albuterol/proair 1 Inhaler 1  . cephALEXin (KEFLEX) 500 MG capsule Take 1 capsule (500 mg total) by mouth 3 (three) times daily. 21 capsule 0  . cetirizine (ZYRTEC) 10 MG tablet Take 1 tablet (10 mg total) by mouth daily. 30 tablet 11  . fluticasone (FLONASE) 50 MCG/ACT nasal spray SPRAY 2 SPRAYS INTO EACH NOSTRIL EVERY DAY 16 mL 1  . HYDROcodone-homatropine (HYCODAN) 5-1.5 MG/5ML syrup Take 5 mLs by mouth every 8 (eight) hours as needed for cough. Sedation caution 75 mL 0  . ibuprofen (ADVIL) 200 MG tablet Take 2 tablets (400 mg total) by mouth every 6 (six) hours as needed for fever, headache, mild pain or moderate pain. With food 30 tablet 1  . predniSONE (DELTASONE) 10 MG tablet Take 3 tabs on days 1-2, take 2 tabs on days 3-4, take 1 tab on days 5-6 12 tablet 0   No current facility-administered medications for this visit.     No Known Allergies  Family History  Problem Relation Age of Onset  . Uterine cancer Mother   . Hypertension Father   . Diabetes Maternal Grandmother   . COPD Maternal Grandfather   . Diabetes Paternal Grandmother     Social History   Socioeconomic History  . Marital  status: Married    Spouse name: Not on file  . Number of children: Not on file  . Years of education: Not on file  . Highest education level: Not on file  Occupational History  . Not on file  Social Needs  . Financial resource strain: Not on file  . Food insecurity    Worry: Not on file    Inability: Not on file  . Transportation needs    Medical: Not on file    Non-medical: Not on file  Tobacco Use  . Smoking status: Never Smoker  . Smokeless tobacco: Never Used  Substance and Sexual Activity  . Alcohol use: Not Currently  . Drug use: Never  . Sexual activity: Not on file  Lifestyle  . Physical activity    Days per week: Not on file    Minutes per session: Not on file  . Stress: Not on file  Relationships  . Social Musicianconnections    Talks on phone: Not on file    Gets together: Not on file    Attends religious service: Not on file    Active member of club or organization: Not on file    Attends meetings of clubs or organizations: Not on file    Relationship status: Not on file  . Intimate partner violence    Fear of current or ex partner: Not on file  Emotionally abused: Not on file    Physically abused: Not on file    Forced sexual activity: Not on file  Other Topics Concern  . Not on file  Social History Narrative  . Not on file     Constitutional: Denies fever, malaise, fatigue, headache or abrupt weight changes.  HEENT: Denies eye pain, eye redness, ear pain, ringing in the ears, wax buildup, runny nose, nasal congestion, bloody nose, or sore throat. Respiratory: Denies difficulty breathing, shortness of breath, cough or sputum production.   Cardiovascular: Denies chest pain, chest tightness, palpitations or swelling in the hands or feet.  Gastrointestinal: Denies abdominal pain, bloating, constipation, diarrhea or blood in the stool.  GU: Denies urgency, frequency, pain with urination, burning sensation, blood in urine, odor or discharge. Musculoskeletal:  Denies decrease in range of motion, difficulty with gait, muscle pain or joint pain and swelling.  Skin: Denies redness, rashes, lesions or ulcercations.  Neurological: Denies dizziness, difficulty with memory, difficulty with speech or problems with balance and coordination.  Psych: Denies anxiety, depression, SI/HI.  No other specific complaints in a complete review of systems (except as listed in HPI above).  Objective:   Physical Exam  BP 128/80   Pulse 87   Temp 98.4 F (36.9 C) (Temporal)   Ht 5\' 8"  (1.727 m)   Wt 271 lb (122.9 kg)   LMP 07/30/2018   SpO2 98%   BMI 41.21 kg/m    Wt Readings from Last 3 Encounters:  02/04/18 275 lb (124.7 kg)  10/04/17 276 lb 4 oz (125.3 kg)  06/15/17 278 lb (126.1 kg)    General: Appears her stated age, obese. Skin: Warm, dry and intact. No rashes noted. HEENT: Head: normal shape and size; Eyes: sclera white, no icterus, conjunctiva pink, PERRLA and EOMs intact; Ears: Tm's gray and intact, normal light reflex Neck:  Neck supple, trachea midline. No masses, lumps or thyromegaly present.  Cardiovascular: Normal rate and rhythm. S1,S2 noted.  No murmur, rubs or gallops noted. No JVD or BLE edema.  Pulmonary/Chest: Normal effort and positive vesicular breath sounds. No respiratory distress. No wheezes, rales or ronchi noted.  Abdomen: Soft and nontender. Normal bowel sounds. No distention or masses noted. Liver, spleen and kidneys non palpable. Musculoskeletal: Strength 5/5 BUE/BLE. No difficulty with gait.  Neurological: Alert and oriented. Cranial nerves II-XII grossly intact. Coordination normal.  Psychiatric: Mood and affect normal. Behavior is normal. Judgment and thought content normal.     BMET    Component Value Date/Time   NA 139 03/26/2017 1121   K 3.8 03/26/2017 1121   CL 104 03/26/2017 1121   CO2 30 03/26/2017 1121   GLUCOSE 94 03/26/2017 1121   BUN 14 03/26/2017 1121   CREATININE 0.76 03/26/2017 1121   CALCIUM 9.8  03/26/2017 1121    Lipid Panel     Component Value Date/Time   CHOL 192 03/26/2017 1121   TRIG 102.0 03/26/2017 1121   HDL 40.70 03/26/2017 1121   CHOLHDL 5 03/26/2017 1121   VLDL 20.4 03/26/2017 1121   LDLCALC 130 (H) 03/26/2017 1121    CBC    Component Value Date/Time   WBC 8.8 03/26/2017 1121   RBC 4.84 03/26/2017 1121   HGB 13.5 03/26/2017 1121   HCT 40.2 03/26/2017 1121   PLT 325.0 03/26/2017 1121   MCV 83.1 03/26/2017 1121   MCHC 33.6 03/26/2017 1121   RDW 13.8 03/26/2017 1121    Hgb A1C Lab Results  Component Value Date  HGBA1C 5.1 03/26/2017           Assessment & Plan:    Preventative Health Maintenance:  Encouraged patient to get a flu shot in the fall. Tetanus booster provided today. Pap smear UTD Encouraged seeing dentist biannually and eye doctor as needed. Reinforced healthy, balanced diet and exercise for weight loss. CBC, CMP, Lipids, A1C drawn today.  Return in 1 year for annual wellness exam or sooner if needed.  Webb Silversmith, NP

## 2018-08-21 ENCOUNTER — Other Ambulatory Visit: Payer: Self-pay | Admitting: Internal Medicine

## 2019-03-22 ENCOUNTER — Other Ambulatory Visit: Payer: Self-pay | Admitting: Family Medicine

## 2019-05-06 ENCOUNTER — Telehealth: Payer: Self-pay

## 2019-05-06 NOTE — Telephone Encounter (Signed)
She called in and stated that she had a missed call for an appt for 4/21/ at 10. I told her I didn't see any appt, and that she was confused. I told the pt we are sorry. Pt was okay

## 2019-09-11 ENCOUNTER — Other Ambulatory Visit: Payer: Self-pay | Admitting: Internal Medicine

## 2019-09-26 ENCOUNTER — Other Ambulatory Visit: Payer: Self-pay | Admitting: Internal Medicine

## 2020-01-24 ENCOUNTER — Telehealth (INDEPENDENT_AMBULATORY_CARE_PROVIDER_SITE_OTHER): Payer: Managed Care, Other (non HMO) | Admitting: Family Medicine

## 2020-01-24 ENCOUNTER — Encounter: Payer: Self-pay | Admitting: Family Medicine

## 2020-01-24 VITALS — Ht 68.0 in | Wt 271.0 lb

## 2020-01-24 DIAGNOSIS — A084 Viral intestinal infection, unspecified: Secondary | ICD-10-CM | POA: Diagnosis not present

## 2020-01-24 NOTE — Assessment & Plan Note (Signed)
Supportive care. Rest, fluids. Less likely IBS but no history of issues. Out of work until diarrhea resolved. Note provided. Follow up if not improving early next week for further eval.

## 2020-01-24 NOTE — Progress Notes (Signed)
VIRTUAL VISIT Due to national recommendations of social distancing due to COVID 19, a virtual visit is felt to be most appropriate for this patient at this time.   I connected with the patient on 01/24/20 at 11:20 AM EST by virtual telehealth platform and verified that I am speaking with the correct person using two identifiers.   I discussed the limitations, risks, security and privacy concerns of performing an evaluation and management service by  virtual telehealth platform and the availability of in person appointments. I also discussed with the patient that there may be a patient responsible charge related to this service. The patient expressed understanding and agreed to proceed.  Patient location: Home Provider Location: [facility] Participants: Kerby Nora and Sinahi Critcher   Chief Complaint  Patient presents with  . Abdominal Pain    X 4 days with cramping and loose stools. Stools are starting to get a little more solid.     History of Present Illness: Abdominal Pain This is a new problem. The current episode started in the past 7 days (Had had stomach cramping and diarrhea  the night after eating chili). The onset quality is sudden. Episode frequency: 5-6 episodes a day.Marland Kitchen associated with episdoe of diarrhea. The problem has been waxing and waning. The pain is located in the epigastric region, RUQ and LUQ. The pain is at a severity of 7/10. The pain is moderate. The quality of the pain is cramping. The abdominal pain does not radiate. Associated symptoms include anorexia, belching, diarrhea and nausea. Pertinent negatives include no constipation, dysuria, fever, frequency, hematochezia, myalgias or vomiting. Associated symptoms comments:  No heartburn. The pain is aggravated by eating. The pain is relieved by bowel movements (gatorade and water). She has tried nothing for the symptoms. There is no history of abdominal surgery, Crohn's disease, gallstones, GERD, irritable  bowel syndrome, pancreatitis, PUD or ulcerative colitis. started menses    A coworker has diarrhea.  COVID 19 screen No recent travel or known exposure to COVID19 The patient denies respiratory symptoms of COVID 19 at this time.  The importance of social distancing was discussed today.   Review of Systems  Constitutional: Negative for fever.  Gastrointestinal: Positive for abdominal pain, anorexia, diarrhea and nausea. Negative for constipation, hematochezia and vomiting.  Genitourinary: Negative for dysuria and frequency.  Musculoskeletal: Negative for myalgias.      History reviewed. No pertinent past medical history.  reports that she has never smoked. She has never used smokeless tobacco. She reports previous alcohol use. She reports that she does not use drugs.   Current Outpatient Medications:  .  cetirizine (ZYRTEC) 10 MG tablet, TAKE 1 TABLET BY MOUTH EVERY DAY, Disp: 90 tablet, Rfl: 2 .  fluticasone (FLONASE) 50 MCG/ACT nasal spray, SPRAY 2 SPRAYS INTO EACH NOSTRIL EVERY DAY, Disp: 16 mL, Rfl: 0   Observations/Objective: Height 5\' 8"  (1.727 m), weight 271 lb (122.9 kg).  Physical Exam  Physical Exam Constitutional:      General: The patient is not in acute distress. Pulmonary:     Effort: Pulmonary effort is normal. No respiratory distress.  Neurological:     Mental Status: The patient is alert and oriented to person, place, and time.  Psychiatric:        Mood and Affect: Mood normal.        Behavior: Behavior normal.   Assessment and Plan Problem List Items Addressed This Visit    Viral gastroenteritis - Primary    Supportive care.  Rest, fluids. Less likely IBS but no history of issues. Out of work until diarrhea resolved. Note provided. Follow up if not improving early next week for further eval.           I discussed the assessment and treatment plan with the patient. The patient was provided an opportunity to ask questions and all were answered. The  patient agreed with the plan and demonstrated an understanding of the instructions.   The patient was advised to call back or seek an in-person evaluation if the symptoms worsen or if the condition fails to improve as anticipated.     Kerby Nora, MD

## 2020-01-30 ENCOUNTER — Ambulatory Visit (INDEPENDENT_AMBULATORY_CARE_PROVIDER_SITE_OTHER): Payer: Managed Care, Other (non HMO)

## 2020-01-30 ENCOUNTER — Telehealth: Payer: Managed Care, Other (non HMO) | Admitting: Family Medicine

## 2020-01-30 ENCOUNTER — Ambulatory Visit
Admission: EM | Admit: 2020-01-30 | Discharge: 2020-01-30 | Disposition: A | Discharge status: Home / Self Care | Payer: Managed Care, Other (non HMO) | Attending: Family Medicine | Admitting: Family Medicine

## 2020-01-30 DIAGNOSIS — R1010 Upper abdominal pain, unspecified: Secondary | ICD-10-CM | POA: Diagnosis not present

## 2020-01-30 DIAGNOSIS — Z1152 Encounter for screening for COVID-19: Secondary | ICD-10-CM

## 2020-01-30 DIAGNOSIS — B349 Viral infection, unspecified: Secondary | ICD-10-CM

## 2020-01-30 DIAGNOSIS — R059 Cough, unspecified: Secondary | ICD-10-CM

## 2020-01-30 DIAGNOSIS — R0602 Shortness of breath: Secondary | ICD-10-CM | POA: Diagnosis not present

## 2020-01-30 LAB — POCT URINALYSIS DIP (MANUAL ENTRY)
Bilirubin, UA: NEGATIVE
Glucose, UA: NEGATIVE mg/dL
Ketones, POC UA: NEGATIVE mg/dL
Leukocytes, UA: NEGATIVE
Nitrite, UA: NEGATIVE
Protein Ur, POC: NEGATIVE mg/dL
Spec Grav, UA: 1.025 (ref 1.010–1.025)
Urobilinogen, UA: 0.2 E.U./dL
pH, UA: 6.5 (ref 5.0–8.0)

## 2020-01-30 LAB — POCT URINE PREGNANCY: Preg Test, Ur: NEGATIVE

## 2020-01-30 MED ORDER — ONDANSETRON 4 MG PO TBDP: 4.0000 mg | ORAL_TABLET | Freq: Three times a day (TID) | ORAL | 0 refills | Status: DC | PRN

## 2020-01-30 MED ORDER — KETOROLAC TROMETHAMINE 30 MG/ML IJ SOLN: 30.0000 mg | Freq: Once | INTRAMUSCULAR | Status: DC

## 2020-01-30 MED ORDER — KETOROLAC TROMETHAMINE 30 MG/ML IJ SOLN
30.0000 mg | Freq: Once | INTRAMUSCULAR | Status: AC
  Administered 2020-01-30: 30 mg via INTRAVENOUS

## 2020-01-30 MED ORDER — SODIUM CHLORIDE 0.9 % IV SOLN: Freq: Once | INTRAVENOUS | Status: DC

## 2020-01-30 MED ORDER — ONDANSETRON HCL 4 MG/2ML IJ SOLN
4.0000 mg | Freq: Once | INTRAMUSCULAR | Status: AC
  Administered 2020-01-30: 4 mg via INTRAVENOUS

## 2020-01-30 NOTE — ED Provider Notes (Addendum)
Renaldo Fiddler    CSN: [id number] Arrival date & time: 01/30/20  0930      History   Chief Complaint Chief Complaint  Patient presents with  . Headache  . Sore Throat  . Emesis    HPI Allison Jacobson is a 35 y.o. female.   Patient is a 35 year old female who presents today with complaints of sore throat, headache, lightheadedness and vomiting that started yesterday.  Prior to this for the past week she has had upper abdominal discomfort, back pain, vomiting and diarrhea.  Approximately 5 episodes of diarrhea daily.  Was seen by her primary care and diagnosed with viral gastroenteritis.  Has been drinking fluids.  No fevers.  No recent antibiotic use. No recent travels.  She does still have her gallbladder.  Patient very lightheaded upon arrival here today.  Mildly tachycardic.  Feels like she is going to pass out with standing.  No dysuria, hematuria or urinary frequency.     History reviewed. No pertinent past medical history.  Patient Active Problem List   Diagnosis Date Noted  . Viral gastroenteritis 01/24/2020  . Seasonal allergic rhinitis 03/26/2017    Past Surgical History:  Procedure Laterality Date  . WISDOM TOOTH EXTRACTION      OB History   No obstetric history on file.      Home Medications    Prior to Admission medications   Medication Sig Start Date End Date Taking? Authorizing Provider  ondansetron (ZOFRAN ODT) 4 MG disintegrating tablet Take 1 tablet (4 mg total) by mouth every 8 (eight) hours as needed for nausea or vomiting. 01/30/20  Yes Jerrine Urschel A, NP  cetirizine (ZYRTEC) 10 MG tablet TAKE 1 TABLET BY MOUTH EVERY DAY 03/24/19   Lorre Munroe, NP  fluticasone [facility]) 50 MCG/ACT nasal spray SPRAY 2 SPRAYS INTO EACH NOSTRIL EVERY DAY 09/12/19   Lorre Munroe, NP    Family History Family History  Problem Relation Age of Onset  . Uterine cancer Mother   . Hypertension Father   . Diabetes Maternal Grandmother   . COPD Maternal  Grandfather   . Diabetes Paternal Grandmother     Social History Social History   Tobacco Use  . Smoking status: Never Smoker  . Smokeless tobacco: Never Used  Substance Use Topics  . Alcohol use: Not Currently  . Drug use: Never     Allergies   Patient has no known allergies.   Review of Systems Review of Systems   Physical Exam Triage Vital Signs ED Triage Vitals  Enc Vitals Group     BP 01/30/20 1006 132/67     Pulse Rate 01/30/20 1006 (!) 106     Resp 01/30/20 1006 (!) 24     Temp 01/30/20 1006 98.8 F (37.1 C)     Temp Source 01/30/20 1006 Oral     SpO2 01/30/20 1006 94 %     Weight --      Height --      Head Circumference --      Peak Flow --      Pain Score 01/30/20 1009 5     Pain Loc --      Pain Edu? --      Excl. in GC? --    No data found.  Updated Vital Signs BP 132/67 (BP Location: Left Arm)   Pulse (!) 106   Temp 98.8 F (37.1 C) (Oral)   Resp (!) 24   LMP  (LMP Unknown)  SpO2 94%   Visual Acuity Right Eye Distance:   Left Eye Distance:   Bilateral Distance:    Right Eye Near:   Left Eye Near:    Bilateral Near:     Physical Exam Vitals and nursing note reviewed.  Constitutional:      General: She is not in acute distress.    Appearance: She is not ill-appearing, toxic-appearing or diaphoretic.  HENT:     Head: Normocephalic and atraumatic.     Right Ear: Tympanic membrane and ear canal normal.     Left Ear: Tympanic membrane and ear canal normal.     Nose: Congestion present.     Mouth/Throat:     Pharynx: Oropharynx is clear.  Eyes:     Conjunctiva/sclera: Conjunctivae normal.  Cardiovascular:     Rate and Rhythm: Regular rhythm. Tachycardia present.     Pulses: Normal pulses.     Heart sounds: Normal heart sounds.  Pulmonary:     Effort: Pulmonary effort is normal.     Breath sounds: Normal breath sounds.  Abdominal:     Tenderness: There is abdominal tenderness.  Musculoskeletal:        General: Normal range  of motion.       Back:  Skin:    General: Skin is warm and dry.  Neurological:     Mental Status: She is alert.      UC Treatments / Results  Labs (all labs ordered are listed, but only abnormal results are displayed) Labs Reviewed  POCT URINALYSIS DIP (MANUAL ENTRY) - Abnormal; Notable for the following components:      Result Value   Blood, UA trace-intact (*)    All other components within normal limits  COVID-19, FLU A+B NAA  CBC WITH DIFFERENTIAL/PLATELET  COMPREHENSIVE METABOLIC PANEL  LIPASE  AMYLASE  POCT URINE PREGNANCY    EKG   Radiology DG Chest 2 View  Result Date: 01/30/2020 CLINICAL DATA:  Cough short of breath EXAM: CHEST - 2 VIEW COMPARISON:  None. FINDINGS: The heart size and mediastinal contours are within normal limits. Both lungs are clear. The visualized skeletal structures are unremarkable. IMPRESSION: No active cardiopulmonary disease. Electronically Signed   By: Marlan Palau M.D.   On: 01/30/2020 10:31    Procedures Procedures (including critical care time)  Medications Ordered in UC Medications  0.9 %  sodium chloride infusion (has no administration in time range)  ondansetron (ZOFRAN) injection 4 mg (4 mg Intravenous Given 01/30/20 1126)  ketorolac (TORADOL) 30 MG/ML injection 30 mg (30 mg Intravenous Given 01/30/20 1126)    Initial Impression / Assessment and Plan / UC Course  I have reviewed the triage vital signs and the nursing notes.  Pertinent labs & imaging results that were available during my care of the patient were reviewed by me and considered in my medical decision making (see chart for details).     Pain in the upper abdomen and possible viral illness.  Patient has been having discomfort, upper abdominal and back pain for the past week or so.  Yesterday developed headache, lightheadedness, scratchy throat.  Differentials include gastritis versus cholecystitis versus flu versus COVID.  Patient very ill-appearing today.   Appears to be dehydrated.  We rehydrated her with 1000 normal saline bolus.  Gave Zofran for nausea and Toradol for pain.  Patient feeling much better after treatment.  Unable to obtain blood work after many attempts.  Sent Zofran to the pharmacy for nausea, vomiting as needed. Urinalysis here  negative for any kind of infection.  No pregnancy. Chest x-ray normal without pneumonia or any other concerns COVID and flu swab pending. Recommended rest, hydrate with Gatorade and electrolytes Recommended to follow-up with primary care next week for continued symptoms otherwise if symptoms worsen throughout the weekend she will need to go to the ER for further management and evaluation. Final Clinical Impressions(s) / UC Diagnoses   Final diagnoses:  Viral illness  Pain of upper abdomen     Discharge Instructions     We rehydrated you here today.  Gave you pain medication and nausea medication.  I am sending some Zofran to the pharmacy to use as needed Make sure you are drinking plenty of fluids to include Gatorade, Pedialyte for electrolyte replacement. Your chest x-ray was normal today We were unable to get any blood work today.  If your symptoms worsen through the weekend please go to the ER.  Otherwise follow-up with your primary care doctor.    ED Prescriptions    Medication Sig Dispense Auth. Provider   ondansetron (ZOFRAN ODT) 4 MG disintegrating tablet Take 1 tablet (4 mg total) by mouth every 8 (eight) hours as needed for nausea or vomiting. 20 tablet Dahlia Byes A, NP     PDMP not reviewed this encounter.   Janace Aris, NP 01/30/20 1308    Dahlia Byes A, NP 01/30/20 1311

## 2020-01-30 NOTE — Discharge Instructions (Addendum)
We rehydrated you here today.  Gave you pain medication and nausea medication.  I am sending some Zofran to the pharmacy to use as needed Make sure you are drinking plenty of fluids to include Gatorade, Pedialyte for electrolyte replacement. Your chest x-ray was normal today We were unable to get any blood work today.  If your symptoms worsen through the weekend please go to the ER.  Otherwise follow-up with your primary care doctor.

## 2020-01-30 NOTE — ED Triage Notes (Signed)
Pt presents with symptoms of headache, light headedness, scratchy throat and vomiting that began yesterday

## 2020-02-03 LAB — COVID-19, FLU A+B NAA
Influenza A, NAA: NOT DETECTED
Influenza B, NAA: NOT DETECTED
SARS-CoV-2, NAA: DETECTED — AB

## 2020-05-08 ENCOUNTER — Other Ambulatory Visit: Payer: Self-pay | Admitting: Internal Medicine

## 2020-05-26 MED ORDER — CETIRIZINE HCL 10 MG PO TABS: 10.0000 mg | ORAL_TABLET | Freq: Every day | ORAL | 0 refills | Status: AC

## 2020-05-26 NOTE — Telephone Encounter (Signed)
Last office visit 01/24/2020 for Viral gastroenteritis with Dr. Ermalene Searing.  Last refilled 03/24/2019 for #90 with 2 refills by R. Baity. No TOC appointment scheduled.

## 2020-09-06 ENCOUNTER — Encounter: Payer: Self-pay | Admitting: Family Medicine

## 2020-09-06 ENCOUNTER — Other Ambulatory Visit: Payer: Self-pay

## 2020-09-06 ENCOUNTER — Ambulatory Visit (INDEPENDENT_AMBULATORY_CARE_PROVIDER_SITE_OTHER): Payer: Managed Care, Other (non HMO) | Admitting: Family Medicine

## 2020-09-06 VITALS — BP 130/88 | HR 72 | Temp 98.0°F | Resp 16 | Ht 68.0 in | Wt 274.4 lb

## 2020-09-06 DIAGNOSIS — M542 Cervicalgia: Secondary | ICD-10-CM | POA: Insufficient documentation

## 2020-09-06 DIAGNOSIS — M25511 Pain in right shoulder: Secondary | ICD-10-CM | POA: Diagnosis not present

## 2020-09-06 MED ORDER — MELOXICAM 15 MG PO TABS: 15.0000 mg | ORAL_TABLET | Freq: Every day | ORAL | 0 refills | Status: DC

## 2020-09-06 MED ORDER — CYCLOBENZAPRINE HCL 5 MG PO TABS: 5.0000 mg | ORAL_TABLET | Freq: Three times a day (TID) | ORAL | 0 refills | Status: DC | PRN

## 2020-09-06 NOTE — Assessment & Plan Note (Signed)
Suspect impingement. Advised ice, nsaids. PT referral if symptoms not improving. Discussed a steroid injection could be an option and advised f/u with Dr. Patsy Lager next week if no change. Out of work for 1 week to rest as job requires heavy lifting

## 2020-09-06 NOTE — Patient Instructions (Addendum)
Shoulder pain - Take meloxicam daily - try flexeril at night for spasm/help with sleep - ice the shoulder   #Referral I have placed a referral to a specialist for you. You should receive a phone call from the specialty office. Make sure your voicemail is not full and that if you are able to answer your phone to unknown or new numbers.   It may take up to 2 weeks to hear about the referral. If you do not hear anything in 2 weeks, please call our office and ask to speak with the referral coordinator.

## 2020-09-06 NOTE — Assessment & Plan Note (Signed)
Suspect trapezius strain. Advised rest, NSAIDs, muscle relaxants. PT referral placed as unable to work in current state and plan to start if symptoms do not improve

## 2020-09-06 NOTE — Progress Notes (Signed)
Subjective:     Allison Jacobson is a 35 y.o. female presenting for Arm Pain (Right arm pain x2 weeks. Pt stated that she was pulling her son up off the floor and shortly after that starting having right shoulder/arm pain that is only getting worse. Pt has decreased range of motion and her arm is weak. She is taking tylenol 500 mg PRN. )     Arm Pain  Associated symptoms include tingling. Pertinent negatives include no numbness.  Shoulder Pain  The pain is present in the neck, right shoulder and right arm. This is a new problem. The current episode started 1 to 4 weeks ago. There has been a history of trauma (pulling son off the floor). The problem occurs constantly. The problem has been gradually worsening. The quality of the pain is described as aching. The pain is moderate. Associated symptoms include a limited range of motion and tingling. Pertinent negatives include no fever or numbness. Associated symptoms comments: Weakness in the arm. Exacerbated by: sneezing. She has tried NSAIDS for the symptoms. The treatment provided mild relief.   Left hand dominate Hx of injury when younger   Aleve 2 pills twice daily  Review of Systems  Constitutional:  Negative for fever.  Neurological:  Positive for tingling. Negative for numbness.    Social History   Tobacco Use  Smoking Status Never  Smokeless Tobacco Never        Objective:    BP Readings from Last 3 Encounters:  09/06/20 130/88  01/30/20 132/67  08/05/18 128/80   Wt Readings from Last 3 Encounters:  09/06/20 274 lb 6.4 oz (124.5 kg)  01/24/20 271 lb (122.9 kg)  08/05/18 271 lb (122.9 kg)    BP 130/88 (BP Location: Left Arm, Patient Position: Sitting, Cuff Size: Large)   Pulse 72   Temp 98 F (36.7 C) (Temporal)   Resp 16   Ht 5\' 8"  (1.727 m)   Wt 274 lb 6.4 oz (124.5 kg)   SpO2 98%   BMI 41.72 kg/m    Physical Exam Constitutional:      General: She is not in acute distress.    Appearance: She is  well-developed. She is not diaphoretic.  HENT:     Right Ear: External ear normal.     Left Ear: External ear normal.     Nose: Nose normal.  Eyes:     Conjunctiva/sclera: Conjunctivae normal.  Neck:     Comments: Neg spurlings b/l Cardiovascular:     Rate and Rhythm: Normal rate.  Pulmonary:     Effort: Pulmonary effort is normal.  Musculoskeletal:     Cervical back: Neck supple. No crepitus. Pain with movement (left rotation, flexion/extension) and muscular tenderness (right trapezius muscle) present. No spinous process tenderness.     Comments: Right shoulder: Inspection: no abnormalities Palpation: ttp along the poster, anterior, and over the bicep insertion ROM: decreased active, but normal passive - though with pain beyond the 100 degree on flexion Strength: normal Hawk[place], neer +  Skin:    General: Skin is warm and dry.     Capillary Refill: Capillary refill takes less than 2 seconds.  Neurological:     Mental Status: She is alert. Mental status is at baseline.  Psychiatric:        Mood and Affect: Mood normal.        Behavior: Behavior normal.          Assessment & Plan:   Problem List Items Addressed  This Visit       Other   Acute pain of right shoulder - Primary    Suspect impingement. Advised ice, nsaids. PT referral if symptoms not improving. Discussed a steroid injection could be an option and advised f/u with Dr. Patsy Lager next week if no change. Out of work for 1 week to rest as job requires heavy lifting      Relevant Medications   meloxicam (MOBIC) 15 MG tablet   cyclobenzaprine (FLEXERIL) 5 MG tablet   Other Relevant Orders   Ambulatory referral to Physical Therapy   Neck pain on right side    Suspect trapezius strain. Advised rest, NSAIDs, muscle relaxants. PT referral placed as unable to work in current state and plan to start if symptoms do not improve      Relevant Medications   meloxicam (MOBIC) 15 MG tablet   cyclobenzaprine (FLEXERIL) 5  MG tablet   Other Relevant Orders   Ambulatory referral to Physical Therapy     Return in about 1 week (around 09/13/2020) for with Dr. Patsy Lager for consideration for injection, schedule establish care with [facility].  Lynnda Child, MD  This visit occurred during the SARS-CoV-2 public health emergency.  Safety protocols were in place, including screening questions prior to the visit, additional usage of staff PPE, and extensive cleaning of exam room while observing appropriate contact time as indicated for disinfecting solutions.

## 2020-09-09 ENCOUNTER — Telehealth: Payer: Self-pay | Admitting: Internal Medicine

## 2020-09-09 NOTE — Telephone Encounter (Signed)
Mrs. Quinlan called in and stated that the flexril is not working she took 1 at 6 and is still in pain

## 2020-09-09 NOTE — Telephone Encounter (Signed)
Agree with f/u with Dr. Patsy Lager.   Is she taking Meloxicam?

## 2020-09-09 NOTE — Telephone Encounter (Signed)
[facility] Day - Client TELEPHONE ADVICE RECORD [facility] Patient Name: Allison Jacobson Gender: Female DOB: 01-27-1985 Age: 35 Y 2 M 3 D Return Phone Number: [phone] (Primary) Address: [place] [address] Client [facility] Day - Client Client Site [facility] - Day Physician Copland, Karleen Hampshire - MD Contact Type Call Who Is Calling Patient / Member / Family / Caregiver Call Type Triage / Clinical Relationship To Patient Self Return Phone Number [phone] (Primary) Chief Complaint Arm Pain (no known cause) Reason for Call Symptomatic / Request for Health Information Initial Comment Caller states she has shoulder and arm pain and was prescribed medication that is not helping. Caller states no known injury the pain has been going on for over 2 weeks . GOTO Facility Not Listed [facility] Translation No Nurse Assessment Nurse: Elesa Hacker, RN, Nash Dimmer Date/Time (Eastern Time): 09/09/2020 9:00:38 AM Confirm and document reason for call. If symptomatic, describe symptoms. ---Caller states she has shoulder and arm pain and was prescribed medication that is not helping. Caller states no known injury the pain has been going on for over 2 weeks . caller was seen on Monday. Caller was prescribed Flexural and meloxicam. Pain is 10/10 at this time. Does the patient have any new or worsening symptoms? ---Yes Will a triage be completed? ---Yes Related visit to physician within the last 2 weeks? ---Yes Does the PT have any chronic conditions? (i.e. diabetes, asthma, this includes High risk factors for pregnancy, etc.) ---No Is the patient pregnant or possibly pregnant? (Ask all females between the ages of 28-55) ---No Is this a behavioral health or substance abuse call? ---No Guidelines Guideline Title Affirmed Question Affirmed Notes Nurse Date/Time (Eastern Time) Arm Pain [1]  SEVERE pain AND [2] not improved 2 hours after pain medicine Deaton, RN, Nash Dimmer 09/09/2020 9:02:43 AM PLEASE NOTE: All timestamps contained within this report are represented as [place] Standard Time. CONFIDENTIALTY NOTICE: This fax transmission is intended only for the addressee. It contains information that is legally privileged, confidential or otherwise protected from use or disclosure. If you are not the intended recipient, you are strictly prohibited from reviewing, disclosing, copying using or disseminating any of this information or taking any action in reliance on or regarding this information. If you have received this fax in error, please notify us immediately by telephone so that we can arrange for its return to [place]. Phone: [phone], Toll-Free: [phone], Fax: [phone] Page: 2 of 2 Call Id: [id number] Disp. Time Lamount Cohen Time) Disposition Final User 09/09/2020 9:07:30 AM Go to ED Now (or PCP triage) Yes Deaton, RN, Cory Roughen Disagree/Comply Comply Caller Understands Yes PreDisposition Did not know what to do Care Advice Given Per Guideline GO TO ED NOW (OR PCP TRIAGE): PAIN MEDICINES: * For pain relief, you can take either acetaminophen, ibuprofen, or naproxen. * ACETAMINOPHEN - REGULAR STRENGTH TYLENOL: Take 650 mg (two 325 mg pills) by mouth every 4 to 6 hours as needed. Each Regular Strength Tylenol pill has 325 mg of acetaminophen. The most you should take each day is 3,250 mg (10 pills a day). * ACETAMINOPHEN - EXTRA STRENGTH TYLENOL: Take 1,000 mg (two 500 mg pills) every 8 hours as needed. Each Extra Strength Tylenol pill has 500 mg of acetaminophen. The most you should take each day is 3,000 mg (6 pills a day). CARE ADVICE given per Arm Pain (Adult) guideline. Referrals GO TO FACILITY OTHER - SPECIFY

## 2020-09-09 NOTE — Telephone Encounter (Signed)
I spoke with pt and she said she has realized that she has more pain when applying ice to area or if she is in a cold room the pain worsens. Pt has tried applying a heating pad and that helps the pain. Heating pad precautions given and pt voiced understanding. Pt thinks using the heating pad pain is manageable until appt on 09/13/20 at 9:20 with Dr Patsy Lager. Pt said if pain worsens she will go to [facility] prior to appt on 09/13/20. Sending note to [facility] pool and Dr Patsy Lager.

## 2020-09-13 ENCOUNTER — Encounter: Payer: Self-pay | Admitting: Family Medicine

## 2020-09-13 ENCOUNTER — Other Ambulatory Visit: Payer: Self-pay

## 2020-09-13 ENCOUNTER — Ambulatory Visit (INDEPENDENT_AMBULATORY_CARE_PROVIDER_SITE_OTHER): Payer: Managed Care, Other (non HMO) | Admitting: Family Medicine

## 2020-09-13 VITALS — BP 130/90 | HR 89 | Temp 98.5°F | Ht 68.0 in | Wt 269.5 lb

## 2020-09-13 DIAGNOSIS — M542 Cervicalgia: Secondary | ICD-10-CM | POA: Diagnosis not present

## 2020-09-13 DIAGNOSIS — M25511 Pain in right shoulder: Secondary | ICD-10-CM

## 2020-09-13 DIAGNOSIS — S46811A Strain of other muscles, fascia and tendons at shoulder and upper arm level, right arm, initial encounter: Secondary | ICD-10-CM

## 2020-09-13 MED ORDER — CYCLOBENZAPRINE HCL 10 MG PO TABS: 5.0000 mg | ORAL_TABLET | Freq: Three times a day (TID) | ORAL | 1 refills | Status: DC | PRN

## 2020-09-13 MED ORDER — PREDNISONE 20 MG PO TABS: ORAL_TABLET | ORAL | 0 refills | Status: DC

## 2020-09-13 NOTE — Progress Notes (Signed)
Allison Urbani T. Heavenlee Maiorana, MD, CAQ Sports Medicine [facility] at [facility] [address] [place], [address]  Phone: [phone]  FAX: [phone]  Allison Jacobson - 35 y.o. female  MRN [id number]  Date of Birth: 06/27/1985  Date: 09/13/2020  PCP: Lorre Munroe, NP  Referral: Lorre Munroe, NP  Chief Complaint  Patient presents with   Shoulder Pain    Right-radiates down arm-Seen by Dr. Selena Batten on 09/06/20   Neck Pain    This visit occurred during the SARS-CoV-2 public health emergency.  Safety protocols were in place, including screening questions prior to the visit, additional usage of staff PPE, and extensive cleaning of exam room while observing appropriate contact time as indicated for disinfecting solutions.   Subjective:   Allison Jacobson is a 35 y.o. very pleasant female patient with Body mass index is 40.98 kg/m. who presents with the following:  She describes an incident where 2 weeks ago she was lifting her 280 pound son off of the ground without much assistance from him, and without an acute severe pain she developed some severe pain later in the day.  Right now she has pain and weakness that is quite severe around the shoulder blade, shoulder blade/scapular stabilizers.  She also has some pain going down her arm.  She has no significant old injuries to the affected shoulder or neck, and she has no prior surgeries whatsoever. When she walks into the examination room she is guarding her shoulder and elbow.  She also describes some decreased sensation on the right first digit fingertip only.  2 weeks now, started around the shoulder blade.  Arm down  Pain, weakness Son lying on the floor and he was here for football.  Pulled her son up, and he is 280 pounds. No immediate pain.  Not long  No old injuries  Dec grip on the R side  R thumb tip dec sensation  Diffuse neck, trap, rhomb, intra and lateral RTC Biceps tendon  Flexeril 5 mg tid  -this has kept her pain at bay, but she is waking up in the middle of the night.  Out of work last week Feels like there is no way that she can return to work now.   Out of work x 3 weeks  PT referral has been made  Review of Systems is noted in the HPI, as appropriate   Objective:   BP 130/90   Pulse 89   Temp 98.5 F (36.9 C) (Temporal)   Ht 5\' 8"  (1.727 m)   Wt 269 lb 8 oz (122.2 kg)   SpO2 96%   BMI 40.98 kg/m   GEN: No acute distress; alert,appropriate. PULM: Breathing comfortably in no respiratory distress PSYCH: Normally interactive.    Notably guarding her arm and shoulder with the elbow bent at 90 degrees adjacent to her anterior chest.  She has diffuse tenderness in the posterior aspect of her neck all the way to the occiput including the paraspinous musculature as well as extensive involvement of the entirety of the trapezius and rhomboids.  She also has some significant tenderness in the posterior aspect of her shoulder region including the infraspinatus and posterior deltoid.  She also has tenderness at the proximal bicep and in the bicipital groove.  Passively, I am able to achieve full range of motion at the shoulder.  Actively, this is decreased more in abduction and flexion.   Strength is preserved in abduction, flexion, as well as rotational  movements without extreme endpoints pain.  She does have decreased strength in the grip on the right compared to the left and she also has some decreased sensation at the thumb distal to the IP joint.  Radiology: No results found.  Assessment and Plan:     ICD-10-CM   1. Trapezius strain, right, initial encounter  S46.811A     2. Acute pain of right shoulder  M25.511 cyclobenzaprine (FLEXERIL) 10 MG tablet    3. Neck pain on right side  M54.2 cyclobenzaprine (FLEXERIL) 10 MG tablet     She really has severe pain in the entirety of the right side of the neck, and all scapular stabilizers of the shoulder and  including the posterior rotator cuff.  This stems from acute lifting of her 280 pound son.  I doubt any rotator cuff tear or other injury aside from extensive muscle injury as above.  Right she is notably limited and is having trouble standing for periods of time.  I am going to keep her out of work, she was out of work all of last week.  She needs to begin some basic range of motion as well.  My biggest concern is if she continues to guard her shoulder she may develop a frozen shoulder.  I went over this in detail.  She is to work on all manner of range of motion.  She is having some good pain relief with Flexeril, and I am going to increase her dosing at nighttime.  Failure of NSAIDs, pulse with a dose of steroids.  Meds ordered this encounter  Medications   cyclobenzaprine (FLEXERIL) 10 MG tablet    Sig: Take 0.5-1 tablets (5-10 mg total) by mouth 3 (three) times daily as needed for muscle spasms (1 tab po qhs).    Dispense:  60 tablet    Refill:  1   predniSONE (DELTASONE) 20 MG tablet    Sig: 2 tabs po for 7 days, then 1 tab po for 7 days    Dispense:  21 tablet    Refill:  0   Medications Discontinued During This Encounter  Medication Reason   ondansetron (ZOFRAN ODT) 4 MG disintegrating tablet No longer needed (for PRN medications)   cyclobenzaprine (FLEXERIL) 5 MG tablet    No orders of the defined types were placed in this encounter.   Follow-up: Return in about 3 weeks (around 10/04/2020) for neck and shoulder follow-up.  [employer] speech-to-text software was used for transcription in this dictation.  Possible transcriptional errors can occur using Animal nutritionist.   Signed,  Elpidio Galea. Brigit Doke, MD   Outpatient Encounter Medications as of 09/13/2020  Medication Sig   cetirizine (ZYRTEC) 10 MG tablet Take 1 tablet (10 mg total) by mouth daily.   fluticasone (FLONASE) 50 MCG/ACT nasal spray SPRAY 2 SPRAYS INTO EACH NOSTRIL EVERY DAY   meloxicam (MOBIC) 15 MG  tablet Take 1 tablet (15 mg total) by mouth daily.   predniSONE (DELTASONE) 20 MG tablet 2 tabs po for 7 days, then 1 tab po for 7 days   [DISCONTINUED] cyclobenzaprine (FLEXERIL) 5 MG tablet Take 1 tablet (5 mg total) by mouth 3 (three) times daily as needed for muscle spasms.   cyclobenzaprine (FLEXERIL) 10 MG tablet Take 0.5-1 tablets (5-10 mg total) by mouth 3 (three) times daily as needed for muscle spasms (1 tab po qhs).   [DISCONTINUED] ondansetron (ZOFRAN ODT) 4 MG disintegrating tablet Take 1 tablet (4 mg total) by mouth every 8 (  eight) hours as needed for nausea or vomiting.   No facility-administered encounter medications on file as of 09/13/2020.

## 2020-09-23 ENCOUNTER — Telehealth: Payer: Self-pay | Admitting: Family Medicine

## 2020-09-23 NOTE — Telephone Encounter (Signed)
Mrs Leith stated that there is a fax number on the form and when he is done it can be sent there , its on the very last paper

## 2020-09-23 NOTE — Telephone Encounter (Signed)
Can you call and let her know that we are packing and moving right now, and there is a significant chance that her FMLA documents may be misplaced in the move.  If not received by next Wed, bring back or fax to [employer].

## 2020-09-23 NOTE — Telephone Encounter (Signed)
Pt brought FMLA paperwork  Type of forms received:fmla  Routed HE:KBTCY  Paperwork received by : terrill   Individual made aware of 3-5 business day turn around (Y/N): y Form completed and patient made aware of charges(Y/N):  y Faxed to :   Form location:  put in dr. box

## 2020-09-24 NOTE — Telephone Encounter (Signed)
FMLA paperwork placed in Dr. Patsy Lager officer in box to complete.

## 2020-09-27 NOTE — Telephone Encounter (Signed)
This has been done.  We will FAX to her employer as requested in her paperwork.

## 2020-09-27 NOTE — Telephone Encounter (Signed)
FMLA forms faxed to [phone] as requested.

## 2020-10-03 ENCOUNTER — Other Ambulatory Visit: Payer: Self-pay | Admitting: Family Medicine

## 2020-10-03 DIAGNOSIS — M25511 Pain in right shoulder: Secondary | ICD-10-CM

## 2020-10-03 DIAGNOSIS — M542 Cervicalgia: Secondary | ICD-10-CM

## 2020-10-04 ENCOUNTER — Other Ambulatory Visit: Payer: Self-pay

## 2020-10-04 ENCOUNTER — Ambulatory Visit (INDEPENDENT_AMBULATORY_CARE_PROVIDER_SITE_OTHER): Payer: Managed Care, Other (non HMO) | Admitting: Family Medicine

## 2020-10-04 ENCOUNTER — Encounter: Payer: Self-pay | Admitting: Family Medicine

## 2020-10-04 VITALS — BP 120/72 | HR 102 | Temp 98.2°F | Ht 68.0 in | Wt 272.1 lb

## 2020-10-04 DIAGNOSIS — M542 Cervicalgia: Secondary | ICD-10-CM | POA: Diagnosis not present

## 2020-10-04 DIAGNOSIS — M25511 Pain in right shoulder: Secondary | ICD-10-CM

## 2020-10-04 NOTE — Progress Notes (Signed)
Pearley Millington T. Felishia Wartman, MD, CAQ Sports Medicine [facility] at [facility] [address] [place], [address]  Phone: [phone]  FAX: [phone]  Allison Jacobson - 35 y.o. female  MRN [id number]  Date of Birth: 12-23-1985  Date: 10/04/2020  PCP: Lorre Munroe, NP  Referral: Lorre Munroe, NP  Chief Complaint  Patient presents with   Follow-up    Shoulder/Neck Pain    This visit occurred during the SARS-CoV-2 public health emergency.  Safety protocols were in place, including screening questions prior to the visit, additional usage of staff PPE, and extensive cleaning of exam room while observing appropriate contact time as indicated for disinfecting solutions.   Subjective:   Allison Jacobson is a 35 y.o. very pleasant female patient with Body mass index is 41.38 kg/m. who presents with the following:  She is here for evaluation of what I thought was primarily trapezius strain where I saw her initially on September 13, 2020.  She saw my partner on September 06, 2020 for ongoing continued right shoulder pain.  At her last office visit, I wrote a note for her to be out of work for 3 weeks, and then completed some FMLA paperwork for her. She also was given a round of some oral steroids and some Flexeril.  Some pain.  This is mostly better at this point in her range of motion at the shoulder and at the neck is approaching full.  She does still have some focal upper trap tenderness, but this is globally much better than her initial evaluation.  ROM is better Has been trying to lift a little bit.   At work, the maximal amount she will lift to be a case of beer, and sometimes a water jug, large.  She thinks that she will be able to do this with bracing some, and at home she has had her children do some of the heavy lifting.  Review of Systems is noted in the HPI, as appropriate   Objective:   BP 120/72   Pulse (!) 102   Temp 98.2 F (36.8 C) (Temporal)    Ht 5\' 8"  (1.727 m)   Wt 272 lb 2 oz (123.4 kg)   SpO2 98%   BMI 41.38 kg/m    Shoulder: Right Inspection: No muscle wasting or winging Ecchymosis/edema: neg  AC joint, scapula, clavicle: NT Cervical spine: NT, full ROM She does have some tenderness in the upper trapezius. Spurling's: neg Abduction: full, 5/5 Flexion: full, 5/5 IR, full, lift-off: 5/5 ER at neutral: full, 5/5 AC crossover and compression: neg Neer: neg Hawkins: neg Drop Test: neg Empty Can: neg Supraspinatus insertion: NT Bicipital groove: NT Speed's: neg Yergason's: neg Sulcus sign: neg Scapular dyskinesis: none C5-T1 intact Sensation intact Grip 5/5   Radiology: No results found.  Assessment and Plan:     ICD-10-CM   1. Neck pain on right side  M54.2     2. Acute pain of right shoulder  M25.511      She really is doing much better, her motion is better, she has no significant pain or minimal pain with motion.  She does have some pain with terminal rotation to the left, but much improved.  Trapezius pain is improved with some mild focal location in the upper trap.  Continue with range of motion, and I suggested some massage if possible at home.  Returned to work, no limitation.  She can follow-up with me if needed.   One speech-to-text software was used for transcription in this dictation.  Possible transcriptional errors can occur using Animal nutritionist.   Signed,  Elpidio Galea. Azell Bill, MD   Outpatient Encounter Medications as of 10/04/2020  Medication Sig   cetirizine (ZYRTEC) 10 MG tablet Take 1 tablet (10 mg total) by mouth daily.   fluticasone (FLONASE) 50 MCG/ACT nasal spray SPRAY 2 SPRAYS INTO EACH NOSTRIL EVERY DAY   cyclobenzaprine (FLEXERIL) 10 MG tablet Take 0.5-1 tablets (5-10 mg total) by mouth 3 (three) times daily as needed for muscle spasms (1 tab po qhs). (Patient not taking: Reported on 10/04/2020)   [DISCONTINUED] meloxicam (MOBIC) 15 MG tablet Take 1  tablet (15 mg total) by mouth daily.   [DISCONTINUED] predniSONE (DELTASONE) 20 MG tablet 2 tabs po for 7 days, then 1 tab po for 7 days   No facility-administered encounter medications on file as of 10/04/2020.

## 2021-04-11 ENCOUNTER — Telehealth: Payer: Self-pay | Admitting: *Deleted

## 2021-04-11 ENCOUNTER — Telehealth: Payer: Managed Care, Other (non HMO) | Admitting: Family Medicine

## 2021-04-11 ENCOUNTER — Other Ambulatory Visit: Payer: Self-pay

## 2021-04-11 NOTE — Telephone Encounter (Signed)
Spoke to patient by telephone and was advised that when she spoke to access nurse she was given home care advice. Patient stated that she started with symptoms yesterday and feels that she may be having problems with seasonal allergies. Patient stated that she felt this morning like her throat may be closing up but is feeling better now.  Patient stated that she is very fatigue, has a sore throat and congestion in her throat. Patient stated that she is at work but heading home now to take some Theraflu and get some sleep. Patient stated that she has not done a covid test but will do one when she gets home. Patient scheduled for a virtual visit with Dr. Milinda Antis today at 4:00 pm. Patient stated that she will call back with her covid test results. Patient was given ER precautions and she verbalized understanding. ?Patient was advised that she needs to schedule an appointment for [facility] since her provider is no longer at this office and she verbalized understanding. ?

## 2021-04-11 NOTE — Telephone Encounter (Signed)
PLEASE NOTE: All timestamps contained within this report are represented as [place] Standard Time. ?CONFIDENTIALTY NOTICE: This fax transmission is intended only for the addressee. It contains information that is legally privileged, confidential or ?otherwise protected from use or disclosure. If you are not the intended recipient, you are strictly prohibited from reviewing, disclosing, copying using ?or disseminating any of this information or taking any action in reliance on or regarding this information. If you have received this fax in error, please ?notify us immediately by telephone so that we can arrange for its return to [place]. Phone: [phone], Toll-Free: [phone], Fax: [phone] ?Page: 1 of 2 ?Call Id: [id number] ?[facility] Day - Client ?TELEPHONE ADVICE RECORD ?AccessNurse? ?Patient ?Name: ?Allison Jacobson ?Gender: Female ?DOB: 1985/11/23 ?Age: 36 Y 14 M 5 D ?Return ?Phone ?Number: ?[phone] ?(Primary) ?Address: ?City/ ?State/ ?Zip: [place] ? [address] ?Client [facility] Day - Client ?Client Site [facility] [place] - Day ?Provider Copland, Karleen Hampshire - MD ?Contact Type Call ?Who Is Calling Patient / Member / Family / Caregiver ?Call Type Triage / Clinical ?Relationship To Patient Self ?Return Phone Number [phone] (Primary) ?Chief Complaint Fatigue (greater than THREE MONTHS old) ?Reason for Call Symptomatic / Request for Health Information ?Initial Comment Caller states she is having sore throat, fatigue. ?Translation No ?Nurse Assessment ?Nurse: Kizzie Bane, RN, Marylene Land Date/Time (Eastern Time): 04/11/2021 9:38:00 AM ?Confirm and document reason for call. If ?symptomatic, describe symptoms. ?---Caller states she is having a sore throat with a ?runny nose and she has fatigue. No fever but she has a ?headache. ?Does the patient have any new or worsening ?symptoms? ---Yes ?Will a triage be completed? ---Yes ?Related visit to physician  within the last 2 weeks? ---No ?Does the PT have any chronic conditions? (i.e. ?diabetes, asthma, this includes High risk factors for ?pregnancy, etc.) ?---No ?Is the patient pregnant or possibly pregnant? (Ask ?all females between the ages of 78-55) ---No ?Is this a behavioral health or substance abuse call? ---No ?Guidelines ?Guideline Title Affirmed Question Affirmed Notes Nurse Date/Time (Eastern ?Time) ?Sore Throat [1] Sore throat with ?cough/cold symptoms ?AND [2] present < 5 ?days ?Kizzie Bane, RN, Marylene Land 04/11/2021 9:40:02 ?AM ?Disp. Time (Eastern ?Time) Disposition Final User ?04/11/2021 9:43:16 AM Home Care Yes Kizzie Bane, RN, Marylene Land ?PLEASE NOTE: All timestamps contained within this report are represented as [place] Standard Time. ?CONFIDENTIALTY NOTICE: This fax transmission is intended only for the addressee. It contains information that is legally privileged, confidential or ?otherwise protected from use or disclosure. If you are not the intended recipient, you are strictly prohibited from reviewing, disclosing, copying using ?or disseminating any of this information or taking any action in reliance on or regarding this information. If you have received this fax in error, please ?notify us immediately by telephone so that we can arrange for its return to [place]. Phone: [phone], Toll-Free: [phone], Fax: [phone] ?Page: 2 of 2 ?Call Id: [id number] ?Caller Disagree/Comply Comply ?Caller Understands Yes ?PreDisposition Did not know what to do ?Care Advice Given Per Guideline ?HOME CARE: * You should be able to treat this at home. REASSURANCE AND EDUCATION: * Most mild sore throats and ?intermittent sore throats are just part of a cold and can be treated at home. * Gargle with warm salt water four times a day. To make ?salt water, put 1/2 teaspoon of salt in 8 oz (240 ml) of warm water. SORE THROAT: DRINK PLENTY OF LIQUIDS: * Drink ?plenty of liquids. It is important  to stay well-hydrated. * IBUPROFEN (E.G.,  MOTRIN, ADVIL): Take 400 mg (two 200 mg pills) ?by mouth every 6 hours. The most you should take is 6 pills a day (1,200 mg total). EXPECTED COURSE: CALL BACK IF: * Sore ?throat with cold symptoms, and sore throat lasts over 5 days * You become worse * Fever lasts over 3 days CARE ADVICE given ?per Sore Throat (Adult) guideline. ?

## 2021-04-11 NOTE — Telephone Encounter (Signed)
Agree with ER precautions  °Will see her then °

## 2022-01-23 ENCOUNTER — Encounter: Payer: Self-pay | Admitting: Family Medicine

## 2022-01-23 ENCOUNTER — Ambulatory Visit (INDEPENDENT_AMBULATORY_CARE_PROVIDER_SITE_OTHER): Payer: Self-pay | Admitting: Family Medicine

## 2022-01-23 VITALS — BP 122/84 | HR 95 | Temp 97.8°F | Ht 68.0 in | Wt 262.2 lb

## 2022-01-23 DIAGNOSIS — J101 Influenza due to other identified influenza virus with other respiratory manifestations: Secondary | ICD-10-CM

## 2022-01-23 LAB — POCT INFLUENZA A/B
Influenza A, POC: NEGATIVE
Influenza B, POC: POSITIVE — AB

## 2022-01-23 MED ORDER — ONDANSETRON HCL 4 MG PO TABS: 4.0000 mg | ORAL_TABLET | Freq: Three times a day (TID) | ORAL | 0 refills | Status: DC | PRN

## 2022-01-23 MED ORDER — BENZONATATE 200 MG PO CAPS: 200.0000 mg | ORAL_CAPSULE | Freq: Two times a day (BID) | ORAL | 0 refills | Status: DC | PRN

## 2022-01-23 NOTE — Progress Notes (Signed)
SUBJECTIVE:   Chief Complaint  Patient presents with   Influenza    HPI Patient presents to clinic with flulike symptoms.  Reports 39 year old son tested positive for influenza B 5 days ago.  Patient reports she started to show symptoms 3 days ago.  Endorses body aches, nausea/vomiting nonbilious none bloody emesis, cough with small amount of clear mucus, chills and subjective fevers.  Denies any shortness of breath, wheezing or chest pain.  Has not been hydrating well.  Took ibuprofen for headache that resolved.  Also tried TheraFlu and severe cold and flu that did not help with symptoms.  PERTINENT PMH / PSH: None  OBJECTIVE:  BP 122/84   Pulse 95   Temp 97.8 F (36.6 C) (Oral)   Wt 262 lb 3.2 oz (118.9 kg)   SpO2 97%   BMI 39.87 kg/m    Physical Exam Vitals reviewed.  Constitutional:      General: She is not in acute distress.    Appearance: Normal appearance. She is not ill-appearing, toxic-appearing or diaphoretic.  HENT:     Right Ear: Tympanic membrane, ear canal and external ear normal. There is no impacted cerumen.     Left Ear: Tympanic membrane, ear canal and external ear normal. There is no impacted cerumen.     Nose: Nose normal. No congestion or rhinorrhea.     Mouth/Throat:     Mouth: Mucous membranes are moist.     Pharynx: Oropharynx is clear. No oropharyngeal exudate or posterior oropharyngeal erythema.  Eyes:     General:        Right eye: No discharge.        Left eye: No discharge.     Conjunctiva/sclera: Conjunctivae normal.  Cardiovascular:     Rate and Rhythm: Normal rate and regular rhythm.     Heart sounds: Normal heart sounds.  Pulmonary:     Effort: Pulmonary effort is normal. No respiratory distress.     Breath sounds: Normal breath sounds. No wheezing.  Abdominal:     Palpations: Abdomen is soft.  Musculoskeletal:        General: Normal range of motion.  Lymphadenopathy:     Cervical: No cervical adenopathy.  Skin:    General:  Skin is warm and dry.  Neurological:     General: No focal deficit present.     Mental Status: She is alert and oriented to person, place, and time. Mental status is at baseline.  Psychiatric:        Mood and Affect: Mood normal.        Behavior: Behavior normal.        Thought Content: Thought content normal.        Judgment: Judgment normal.     ASSESSMENT/PLAN:  Influenza B Assessment & Plan: POC testing positive for influenza B.  Symptoms started greater than 48 hours.  Tamiflu not indicated at this time. Symptomatic management -Tylenol 325-500 mg every 6 hours as needed -Honey for sore throat and cough -Cepacol lozenges for sore throat -Tessalon Pearles 200 mg twice a day for cough.  Keep out of reach from small children as they can be toxic if ingested in large quantities. -Zofran 4 mg every 8 hours for nausea -Hydrate as much as possible.  30-60 mls an hour.  If cannot p.o.  in the next 24 hours with the addition of Zofran recommend follow-up in urgent care/ED for IV hydration.  Orders: -     POCT Influenza A/B -  Ondansetron HCl; Take 1 tablet (4 mg total) by mouth every 8 (eight) hours as needed for nausea or vomiting.  Dispense: 15 tablet; Refill: 0 -     Benzonatate; Take 1 capsule (200 mg total) by mouth 2 (two) times daily as needed for cough.  Dispense: 20 capsule; Refill: 0   PDMP reviewed  Return if symptoms worsen or fail to improve.  Carollee Leitz, MD

## 2022-01-23 NOTE — Patient Instructions (Addendum)
It was a pleasure meeting you today. Thank you for allowing me to take part in your health care.  Our goals for today as we discussed include:  You tested positive for Influenza B  Symptomatic management for fever, muscle aches and headaches  -Tylenol 325-500 mg every 6 hours as needed -Honey for sore throat and cough -Cepacol lozenges for sore throat -Tessalon Pearles 200 mg twice a day for cough.  Keep out of reach from small children as they can be toxic if ingested in large quantities. -Zofran 4 mg every 8 hours for nausea -Hydrate as much as possible.  30-60 mls an hour.  If you cannot keep anything down in the next 24 hours with the addition of Zofran please have someone take you to the ED for hydration.  Antivirals not effective past 48 hours of onset of symptoms.   Rest as needed with frequent repositioning and ambulation.  Increase activity as soon as tolerated to help with recovery.  Continue wearing masks, hand washing until symptoms resolve.  If you have worsening symptoms, especially difficulty breathing please call 911 or have someone take you to the emergency department.    If you have any questions or concerns, please do not hesitate to call the office at [phone].  I look forward to our next visit and until then take care and stay safe.  Regards,   Carollee Leitz, MD   [facility]

## 2022-01-23 NOTE — Assessment & Plan Note (Signed)
POC testing positive for influenza B.  Symptoms started greater than 48 hours.  Tamiflu not indicated at this time. Symptomatic management -Tylenol 325-500 mg every 6 hours as needed -Honey for sore throat and cough -Cepacol lozenges for sore throat -Tessalon Pearles 200 mg twice a day for cough.  Keep out of reach from small children as they can be toxic if ingested in large quantities. -Zofran 4 mg every 8 hours for nausea -Hydrate as much as possible.  30-60 mls an hour.  If cannot p.o.  in the next 24 hours with the addition of Zofran recommend follow-up in urgent care/ED for IV hydration.

## 2022-01-24 ENCOUNTER — Encounter: Payer: Self-pay | Admitting: Family Medicine

## 2022-01-24 NOTE — Addendum Note (Signed)
Addended by: Jeralyn Bennett A on: 01/24/2022 09:51 AM   Modules accepted: Orders

## 2022-02-10 ENCOUNTER — Encounter: Payer: Self-pay | Admitting: Nurse Practitioner

## 2022-02-10 ENCOUNTER — Ambulatory Visit (INDEPENDENT_AMBULATORY_CARE_PROVIDER_SITE_OTHER): Payer: Self-pay | Admitting: Nurse Practitioner

## 2022-02-10 VITALS — BP 118/74 | HR 65 | Temp 98.6°F | Ht 68.0 in | Wt 266.0 lb

## 2022-02-10 DIAGNOSIS — N390 Urinary tract infection, site not specified: Secondary | ICD-10-CM

## 2022-02-10 DIAGNOSIS — R300 Dysuria: Secondary | ICD-10-CM

## 2022-02-10 LAB — POC URINALSYSI DIPSTICK (AUTOMATED)
Bilirubin, UA: NEGATIVE
Glucose, UA: NEGATIVE
Ketones, UA: NEGATIVE
Nitrite, UA: NEGATIVE
Protein, UA: POSITIVE — AB
Spec Grav, UA: 1.025 (ref 1.010–1.025)
Urobilinogen, UA: 0.2 E.U./dL
pH, UA: 6 (ref 5.0–8.0)

## 2022-02-10 MED ORDER — CIPROFLOXACIN HCL 250 MG PO TABS: 250.0000 mg | ORAL_TABLET | Freq: Two times a day (BID) | ORAL | 0 refills | Status: AC

## 2022-02-10 NOTE — Assessment & Plan Note (Addendum)
Urinalysis positive for leukocytes and protein. Started her on ciprofloxacin twice a day for 5 days. The sample got discarded so we will not able to send urine for culture. Advise patient to call the office if symptoms persist after completing the course of antibiotic. Increase hydration and use OTC azo as needed for pain.

## 2022-02-10 NOTE — Progress Notes (Signed)
Established Patient Office Visit  Subjective:  Patient ID: Allison Jacobson, female    DOB: 1985-09-12  Age: 37 y.o. MRN: [id number]  CC:  Chief Complaint  Patient presents with   Dysuria    X 1 day      HPI  Allison Jacobson presents for:  Dysuria  This is a new problem. The current episode started yesterday. The problem occurs every urination. The problem has been unchanged. The quality of the pain is described as burning. There has been no fever. She is Sexually active. Associated symptoms include frequency and urgency. Pertinent negatives include no chills, discharge, hematuria or nausea. She has tried nothing for the symptoms.     History reviewed. No pertinent past medical history.  Past Surgical History:  Procedure Laterality Date   WISDOM TOOTH EXTRACTION      Family History  Problem Relation Age of Onset   Uterine cancer Mother    Hypertension Father    Diabetes Maternal Grandmother    COPD Maternal Grandfather    Diabetes Paternal Grandmother     Social History   Socioeconomic History   Marital status: Married    Spouse name: Not on file   Number of children: Not on file   Years of education: Not on file   Highest education level: Not on file  Occupational History   Not on file  Tobacco Use   Smoking status: Never   Smokeless tobacco: Never  Substance and Sexual Activity   Alcohol use: Not Currently   Drug use: Never   Sexual activity: Not on file  Other Topics Concern   Not on file  Social History Narrative   Not on file   Social Determinants of Health   Financial Resource Strain: Not on file  Food Insecurity: Not on file  Transportation Needs: Not on file  Physical Activity: Not on file  Stress: Not on file  Social Connections: Not on file  Intimate Partner Violence: Not on file     Outpatient Medications Prior to Visit  Medication Sig Dispense Refill   cetirizine (ZYRTEC) 10 MG tablet Take 1 tablet (10 mg total) by mouth daily.  (Patient not taking: Reported on 02/10/2022) 90 tablet 0   fluticasone (FLONASE) 50 MCG/ACT nasal spray SPRAY 2 SPRAYS INTO EACH NOSTRIL EVERY DAY (Patient not taking: Reported on 02/10/2022) 16 mL 0   benzonatate (TESSALON) 200 MG capsule Take 1 capsule (200 mg total) by mouth 2 (two) times daily as needed for cough. 20 capsule 0   ondansetron (ZOFRAN) 4 MG tablet Take 1 tablet (4 mg total) by mouth every 8 (eight) hours as needed for nausea or vomiting. 15 tablet 0   No facility-administered medications prior to visit.    No Known Allergies  ROS Review of Systems  Constitutional:  Negative for chills.  Respiratory: Negative.    Cardiovascular: Negative.   Gastrointestinal:  Negative for nausea.  Genitourinary:  Positive for dysuria, frequency and urgency. Negative for hematuria.  Neurological: Negative.   Psychiatric/Behavioral: Negative.        Objective:    Physical Exam Constitutional:      Appearance: Normal appearance.  Cardiovascular:     Rate and Rhythm: Normal rate and regular rhythm.     Pulses: Normal pulses.     Heart sounds: Normal heart sounds. No murmur heard.    No friction rub.  Pulmonary:     Effort: Pulmonary effort is normal.  Abdominal:     General: Bowel sounds are  normal.     Palpations: Abdomen is soft. There is no mass.     Tenderness: There is abdominal tenderness in the right lower quadrant and suprapubic area. There is no right CVA tenderness or left CVA tenderness.  Neurological:     Mental Status: She is alert.     BP 118/74   Pulse 65   Temp 98.6 F (37 C) (Oral)   Ht 5\' 8"  (1.727 m)   Wt 266 lb (120.7 kg)   SpO2 94%   BMI 40.45 kg/m  Wt Readings from Last 3 Encounters:  02/10/22 266 lb (120.7 kg)  01/23/22 262 lb 3.2 oz (118.9 kg)  10/04/20 272 lb 2 oz (123.4 kg)     Health Maintenance  Topic Date Due   COVID-19 Vaccine (1) Never done   Hepatitis C Screening  Never done   PAP SMEAR-Modifier  03/26/2020   INFLUENZA VACCINE   04/16/2022 (Originally 08/16/2021)   DTaP/Tdap/Td (3 - Td or Tdap) 08/04/2028   HIV Screening  Completed   HPV VACCINES  Aged Out    There are no preventive care reminders to display for this patient.  Lab Results  Component Value Date   TSH 1.64 03/26/2017   Lab Results  Component Value Date   WBC 7.2 08/05/2018   HGB 12.6 08/05/2018   HCT 39.1 08/05/2018   MCV 84.8 08/05/2018   PLT 271.0 08/05/2018   Lab Results  Component Value Date   NA 138 08/05/2018   K 4.1 08/05/2018   CO2 28 08/05/2018   GLUCOSE 99 08/05/2018   BUN 13 08/05/2018   CREATININE 0.77 08/05/2018   BILITOT 0.3 08/05/2018   ALKPHOS 60 08/05/2018   AST 14 08/05/2018   ALT 24 08/05/2018   PROT 6.7 08/05/2018   ALBUMIN 3.9 08/05/2018   CALCIUM 8.8 08/05/2018   GFR 85.27 08/05/2018   Lab Results  Component Value Date   CHOL 181 08/05/2018   Lab Results  Component Value Date   HDL 34.10 (L) 08/05/2018   Lab Results  Component Value Date   LDLCALC 125 (H) 08/05/2018   Lab Results  Component Value Date   TRIG 109.0 08/05/2018   Lab Results  Component Value Date   CHOLHDL 5 08/05/2018   Lab Results  Component Value Date   HGBA1C 5.2 08/05/2018      Assessment & Plan:   Problem List Items Addressed This Visit       Genitourinary   Urinary tract infection without hematuria - Primary    Urinalysis positive for leukocytes and protein. Started her on ciprofloxacin twice a day for 5 days. The sample got discarded so we will not able to send urine for culture. Advise patient to call the office if symptoms persist after completing the course of antibiotic. Increase hydration and use OTC azo as needed for pain.       Other Visit Diagnoses     Dysuria       Relevant Orders   POCT Urinalysis Dipstick (Automated) (Completed)        Meds ordered this encounter  Medications   ciprofloxacin (CIPRO) 250 MG tablet    Sig: Take 1 tablet (250 mg total) by mouth 2 (two) times daily for 5  days.    Dispense:  10 tablet    Refill:  0     Follow-up: No follow-ups on file.    Theresia Lo, NP

## 2022-02-10 NOTE — Patient Instructions (Addendum)
Urinalysis shows some infection and proteins. Rx ciprofloxacin 250 mg twice a day for 7 days. Increase hydration and use OTC Azo for pain relief.

## 2022-02-13 ENCOUNTER — Other Ambulatory Visit: Payer: Self-pay

## 2022-04-21 ENCOUNTER — Encounter: Payer: Self-pay | Admitting: Family

## 2022-09-02 IMAGING — DX DG CHEST 2V
2 series · 2 of 2 positions shown · non-contrast
Comparison: None.

CLINICAL DATA: Cough short of breath

EXAM:
CHEST - 2 VIEW

[chest pa]
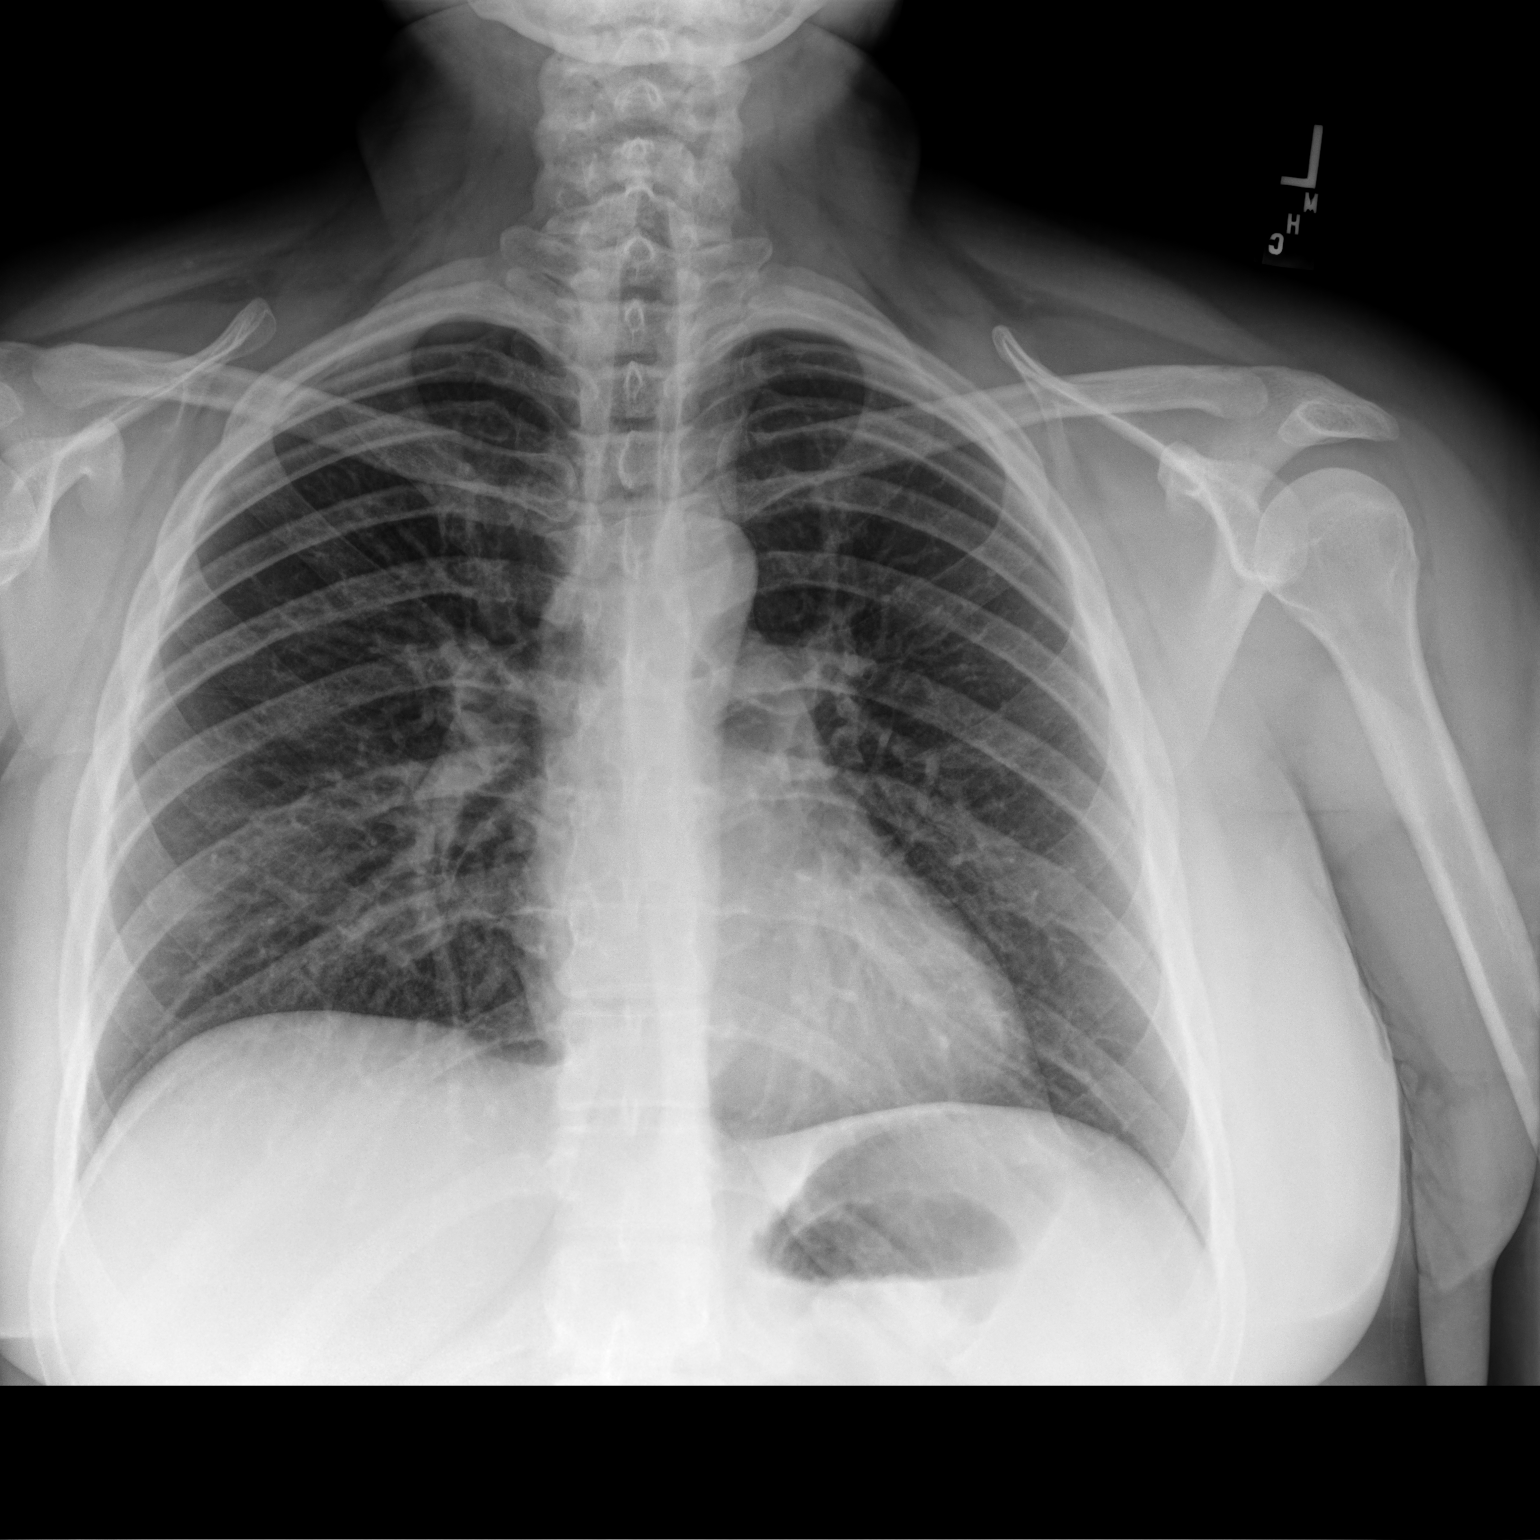

[chest lat]
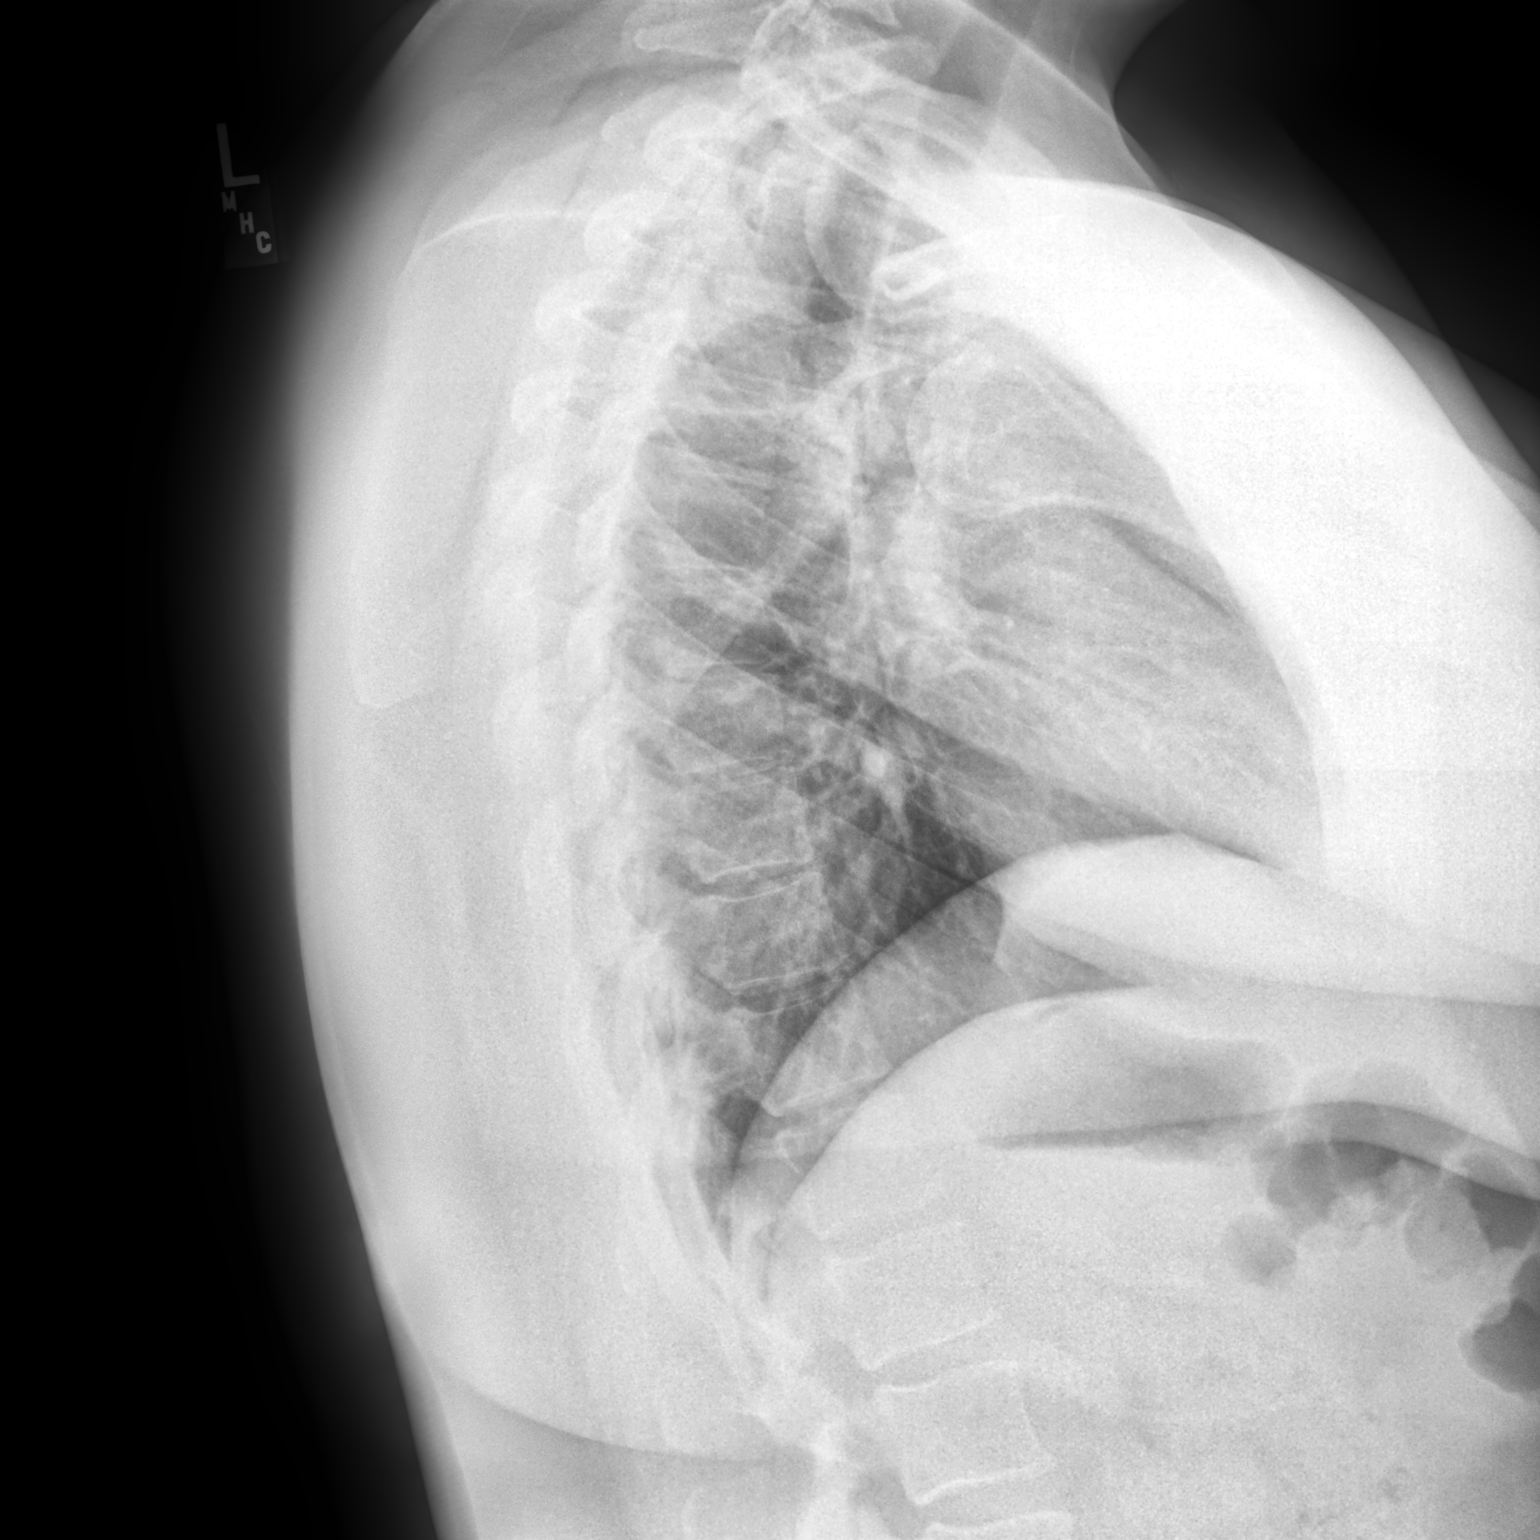

[2 of 2 positions shown; findings below may reference images not displayed]

FINDINGS: The heart size and mediastinal contours are within normal limits.
Both lungs are clear. The visualized skeletal structures are
unremarkable.
IMPRESSION: No active cardiopulmonary disease.

## 2022-09-11 ENCOUNTER — Ambulatory Visit
Admission: EM | Admit: 2022-09-11 | Discharge: 2022-09-11 | Disposition: A | Discharge status: Home / Self Care | Payer: BC Managed Care – PPO | Attending: Emergency Medicine | Admitting: Emergency Medicine

## 2022-09-11 DIAGNOSIS — U071 COVID-19: Secondary | ICD-10-CM | POA: Insufficient documentation

## 2022-09-11 DIAGNOSIS — J069 Acute upper respiratory infection, unspecified: Secondary | ICD-10-CM

## 2022-09-11 DIAGNOSIS — J029 Acute pharyngitis, unspecified: Secondary | ICD-10-CM | POA: Diagnosis not present

## 2022-09-11 LAB — POCT RAPID STREP A (OFFICE): Rapid Strep A Screen: NEGATIVE

## 2022-09-11 MED ORDER — ONDANSETRON HCL 4 MG/2ML IJ SOLN
4.0000 mg | Freq: Once | INTRAMUSCULAR | Status: AC
  Administered 2022-09-11: 4 mg via INTRAMUSCULAR

## 2022-09-11 MED ORDER — IPRATROPIUM BROMIDE 0.03 % NA SOLN: 2.0000 | Freq: Two times a day (BID) | NASAL | 12 refills | Status: AC

## 2022-09-11 MED ORDER — ONDANSETRON 4 MG PO TBDP
4.0000 mg | ORAL_TABLET | Freq: Once | ORAL | Status: AC
  Administered 2022-09-11: 4 mg via ORAL

## 2022-09-11 MED ORDER — ONDANSETRON 4 MG PO TBDP: 4.0000 mg | ORAL_TABLET | Freq: Three times a day (TID) | ORAL | 0 refills | Status: AC | PRN

## 2022-09-11 MED ORDER — CYCLOBENZAPRINE HCL 5 MG PO TABS: 5.0000 mg | ORAL_TABLET | Freq: Three times a day (TID) | ORAL | 0 refills | Status: AC | PRN

## 2022-09-11 NOTE — ED Triage Notes (Signed)
Patient to Urgent Care with complaints of sore throat/ chills that started Friday. Started developing body aches/ fevers/ nasal congestion/ headaches over the weekend.   Reports generalized weakness that started today.  Has been taking Dayquil/ Nyquil.

## 2022-09-11 NOTE — ED Provider Notes (Signed)
Renaldo Fiddler    CSN: [id number] Arrival date & time: 09/11/22  1022      History   Chief Complaint Chief Complaint  Patient presents with   Sore Throat   Chills    HPI Allison Jacobson is a 37 y.o. female.   Patient presents for evaluation of subjective fever, chills body aches, nasal congestion, rhinorrhea, sore throat, productive cough and vomiting beginning 3 days ago.  Experiencing weakness this morning.  Last occurrence of vomiting this morning unable to tolerate food or liquids.  Endorses generalized cramping to the body as well.  No known sick contact prior.  History reviewed. No pertinent past medical history.  Patient Active Problem List   Diagnosis Date Noted   Urinary tract infection without hematuria 02/10/2022   Acute pain of right shoulder 09/06/2020   Neck pain on right side 09/06/2020   Viral gastroenteritis 01/24/2020   Influenza B 02/04/2018   Seasonal allergic rhinitis 03/26/2017    Past Surgical History:  Procedure Laterality Date   WISDOM TOOTH EXTRACTION      OB History   No obstetric history on file.      Home Medications    Prior to Admission medications   Medication Sig Start Date End Date Taking? Authorizing Provider  cetirizine (ZYRTEC) 10 MG tablet Take 1 tablet (10 mg total) by mouth daily. Patient not taking: Reported on 02/10/2022 05/26/20   Eulis Foster, FNP  fluticasone [facility]) 50 MCG/ACT nasal spray SPRAY 2 SPRAYS INTO EACH NOSTRIL EVERY DAY Patient not taking: Reported on 02/10/2022 09/12/19   Lorre Munroe, NP    Family History Family History  Problem Relation Age of Onset   Uterine cancer Mother    Hypertension Father    Diabetes Maternal Grandmother    COPD Maternal Grandfather    Diabetes Paternal Grandmother     Social History Social History   Tobacco Use   Smoking status: Never   Smokeless tobacco: Never  Substance Use Topics   Alcohol use: Not Currently   Drug use: Never     Allergies    Patient has no known allergies.   Review of Systems Review of Systems   Physical Exam Triage Vital Signs ED Triage Vitals  Encounter Vitals Group     BP 09/11/22 1048 (!) 167/96     Systolic BP Percentile --      Diastolic BP Percentile --      Pulse Rate 09/11/22 1048 99     Resp 09/11/22 1048 18     Temp 09/11/22 1048 98.4 F (36.9 C)     Temp Source 09/11/22 1048 Oral     SpO2 09/11/22 1048 94 %     Weight --      Height --      Head Circumference --      Peak Flow --      Pain Score 09/11/22 1041 7     Pain Loc --      Pain Education --      Exclude from Growth Chart --    No data found.  Updated Vital Signs BP (!) 167/96 (BP Location: Left Arm)   Pulse 99   Temp 98.4 F (36.9 C) (Oral)   Resp 18   LMP 08/21/2022   SpO2 94%   Visual Acuity Right Eye Distance:   Left Eye Distance:   Bilateral Distance:    Right Eye Near:   Left Eye Near:    Bilateral Near:  Physical Exam Constitutional:      Appearance: She is ill-appearing.  HENT:     Head: Normocephalic.     Right Ear: Tympanic membrane, ear canal and external ear normal.     Left Ear: Tympanic membrane, ear canal and external ear normal.     Nose: Congestion and rhinorrhea present.     Mouth/Throat:     Mouth: Mucous membranes are moist.     Pharynx: Posterior oropharyngeal erythema present. No oropharyngeal exudate.  Eyes:     Extraocular Movements: Extraocular movements intact.  Cardiovascular:     Rate and Rhythm: Normal rate and regular rhythm.     Pulses: Normal pulses.     Heart sounds: Normal heart sounds.  Pulmonary:     Effort: Pulmonary effort is normal.     Breath sounds: Normal breath sounds.  Musculoskeletal:     Cervical back: Normal range of motion.  Lymphadenopathy:     Cervical: Cervical adenopathy present.  Skin:    General: Skin is warm and dry.  Neurological:     Mental Status: She is alert and oriented to person, place, and time. Mental status is at baseline.      UC Treatments / Results  Labs (all labs ordered are listed, but only abnormal results are displayed) Labs Reviewed  POCT RAPID STREP A (OFFICE)    EKG   Radiology No results found.  Procedures Procedures (including critical care time)  Medications Ordered in UC Medications  ondansetron (ZOFRAN) injection 4 mg (has no administration in time range)  ondansetron (ZOFRAN-ODT) disintegrating tablet 4 mg (4 mg Oral Given 09/11/22 1046)    Initial Impression / Assessment and Plan / UC Course  I have reviewed the triage vital signs and the nursing notes.  Pertinent labs & imaging results that were available during my care of the patient were reviewed by me and considered in my medical decision making (see chart for details).  Viral URI  While ill-appearing ,patient is in no signs of distress nor toxic appearing.  Vital signs are stable.  Low suspicion for pneumonia, pneumothorax or bronchitis and therefore will defer imaging.  Rapid strep test negative.COVID test is pending, reviewed quarantine guidelines per CDC recommendations, healthy adult without comorbidities, does not qualify for antiviral.  Actively vomiting in office, ODT Zofran given, ineffective, Zofran IM given, able to tolerate fluids, stable for outpatient management, prescribe Zofran, Atrovent nasal spray and Flexeril for home use.  Visibly having abdominal cramping in office possibly related to dehydration, advised to increase her fluid intake to rehydrate. May use additional over-the-counter medications as needed for supportive care.  May follow-up with urgent care as needed if symptoms persist or worsen.  Note given.   Final Clinical Impressions(s) / UC Diagnoses   Final diagnoses:  Viral URI     Discharge Instructions      Your symptoms today are most likely being caused by a virus and should steadily improve in time it can take up to 7 to 10 days before you truly start to see a turnaround however things  will get better  Strep test negative  COVID testing pending up to 24 hours, you will be notified of positive test results only, if positive you will will need to quarantine if experiencing fever, if no fever may continue activity wearing mask    You can take Tylenol and/or Ibuprofen as needed for fever reduction and pain relief.   For cough: honey 1/2 to 1 teaspoon (you can dilute the  honey in water or another fluid).  You can also use guaifenesin and dextromethorphan for cough. You can use a humidifier for chest congestion and cough.  If you don't have a humidifier, you can sit in the bathroom with the hot shower running.      For sore throat: try warm salt water gargles, cepacol lozenges, throat spray, warm tea or water with lemon/honey, popsicles or ice, or OTC cold relief medicine for throat discomfort.   For congestion: take a daily anti-histamine like Zyrtec, Claritin, and a oral decongestant, such as pseudoephedrine.  You can also use Flonase 1-2 sprays in each nostril daily.   It is important to stay hydrated: drink plenty of fluids (water, gatorade/powerade/pedialyte, juices, or teas) to keep your throat moisturized and help further relieve irritation/discomfort.    ED Prescriptions   None    PDMP not reviewed this encounter.   Valinda Hoar, NP 09/11/22 1149

## 2022-09-11 NOTE — Discharge Instructions (Addendum)
Your symptoms today are most likely being caused by a virus and should steadily improve in time it can take up to 7 to 10 days before you truly start to see a turnaround however things will get better  Strep test negative  COVID testing pending up to 24 hours, you will be notified of positive test results only, if positive you will will need to quarantine if experiencing fever, if no fever may continue activity wearing mask  Been given an injection pill of Zofran here in the office to help minimize nausea and vomiting, you may use at home every 8 hours as needed, increase your fluid intake until you are able to tolerate food at baseline  You may use at your nasal spray every morning and every evening to help clear congestion  You may use muscle relaxant every 8 hours to help with body aching    You can take Tylenol and/or Ibuprofen as needed for fever reduction and pain relief.   For cough: honey 1/2 to 1 teaspoon (you can dilute the honey in water or another fluid).  You can also use guaifenesin and dextromethorphan for cough. You can use a humidifier for chest congestion and cough.  If you don't have a humidifier, you can sit in the bathroom with the hot shower running.      For sore throat: try warm salt water gargles, cepacol lozenges, throat spray, warm tea or water with lemon/honey, popsicles or ice, or OTC cold relief medicine for throat discomfort.   For congestion: take a daily anti-histamine like Zyrtec, Claritin, and a oral decongestant, such as pseudoephedrine.  You can also use Flonase 1-2 sprays in each nostril daily.   It is important to stay hydrated: drink plenty of fluids (water, gatorade/powerade/pedialyte, juices, or teas) to keep your throat moisturized and help further relieve irritation/discomfort.

## 2022-09-12 ENCOUNTER — Ambulatory Visit: Payer: Self-pay | Admitting: Family Medicine

## 2022-09-12 LAB — SARS CORONAVIRUS 2 (TAT 6-24 HRS): SARS Coronavirus 2: POSITIVE — AB

## 2023-10-03 ENCOUNTER — Ambulatory Visit (INDEPENDENT_AMBULATORY_CARE_PROVIDER_SITE_OTHER): Admitting: Family Medicine

## 2023-10-03 ENCOUNTER — Encounter: Payer: Self-pay | Admitting: Family Medicine

## 2023-10-03 VITALS — BP 132/82 | HR 82 | Temp 98.0°F | Ht 68.0 in | Wt 276.0 lb

## 2023-10-03 DIAGNOSIS — F419 Anxiety disorder, unspecified: Secondary | ICD-10-CM | POA: Diagnosis not present

## 2023-10-03 DIAGNOSIS — M940 Chondrocostal junction syndrome [Tietze]: Secondary | ICD-10-CM | POA: Diagnosis not present

## 2023-10-03 MED ORDER — ESCITALOPRAM OXALATE 10 MG PO TABS: 10.0000 mg | ORAL_TABLET | Freq: Every day | ORAL | 3 refills | Status: AC

## 2023-10-03 MED ORDER — HYDROXYZINE HCL 25 MG PO TABS: 12.5000 mg | ORAL_TABLET | Freq: Two times a day (BID) | ORAL | 0 refills | Status: DC | PRN

## 2023-10-03 NOTE — Patient Instructions (Addendum)
 VISIT SUMMARY: Today, you were seen for chest discomfort and a recent panic attack. You have been experiencing significant stress due to personal and family health issues, which has contributed to your symptoms. We discussed your current condition and created a plan to help manage your symptoms and improve your overall well-being.  YOUR PLAN: -COSTOCHONDRITIS OF CHEST WALL: Costochondritis is inflammation of the cartilage that connects a rib to the breastbone, causing chest pain. You should use a heating pad or ice on the affected area, covered with a towel, as needed. Take ibuprofen  600 mg to 800 mg two to three times daily with meals for five days, then as needed. Apply topical over the counter Voltaren gel to the tender area three times daily and continue using Flexeril  as needed for muscle relaxation.  -ANXIETY WITH PANIC ATTACK: Anxiety can cause panic attacks, which are sudden episodes of intense fear that trigger severe physical reactions. You have been prescribed Lexapro  10 mg once daily in the morning, which will take about four weeks to reach full effect. For acute anxiety, take hydroxyzine , half to one pill twice daily as needed. The hydroxyzine  may make you sleepy. We will follow up in three to four weeks to assess how the treatment is working.   INSTRUCTIONS: Please follow up in four weeks to assess your response to the treatment for anxiety. If you have any concerns or if your symptoms worsen, contact our office immediately.

## 2023-10-03 NOTE — Progress Notes (Signed)
 Ph: [phone] Fax: [phone]   Patient ID: Allison Jacobson, female    DOB: 1985-11-07, 38 y.o.   MRN: [id number]  This visit was conducted in person.  BP 132/82   Pulse 82   Temp 98 F (36.7 C) (Oral)   Ht 5' 8 (1.727 m)   Wt 276 lb (125.2 kg)   SpO2 96%   BMI 41.97 kg/m    Chief Complaint  Patient presents with   Medical Management of Chronic Issues    Pt C/o is Chest discomfort. Has a Knot on R side of chest. Also feels tightness and had a panic attack yesterday. Pt states she took Flexeril  5mg     Subjective:   Discussed the use of AI scribe software for clinical note transcription with the patient, who gave verbal consent to proceed.  History of Present Illness   Allison Jacobson is a 38 year old female who presents with chest discomfort and a recent panic attack.  She experienced a panic attack at work yesterday due to significant work stress, with symptoms of difficulty breathing, palpitations, headache, and weakness. The episode was severe enough to leave work early. The morning of the attack, she had right-sided chest discomfort, which worsened during the panic attack. Flexeril  provided some relief. Today, the pain persists, especially with stretching or lifting, and feels like muscle soreness. A knot in her chest becomes more prominent with stress and reduces with relaxation. She notes history of recently lifting heavier boxes at work than she was used to.   She has been under significant stress for the past year and a half, including her husband's mental and physical health (h/o SA, lung surgery) and her son's knee surgery, leading to some financial stress. Husband has returned to work in interim. She had a previous panic attack about a year ago, which was less severe.  She does not smoke, has limited alcohol use, and is not on hormonal treatments. Current medications include Flexeril  as needed and Zyrtec  for allergies. She previously took medication for anxiety,  which she stopped >8 years ago. Denies nausea, vomiting, diarrhea, fever, chills, cough, or recent infections. No recent long car rides or plane rides but plans a ten-hour drive to [place]  this weekend to visit her parents. No recent one-sided leg swelling or pain.      LMP - current Birth control - s/p BTL     Relevant past medical, surgical, family and social history reviewed and updated as indicated. Interim medical history since our last visit reviewed. Allergies and medications reviewed and updated. Outpatient Medications Prior to Visit  Medication Sig Dispense Refill   cetirizine  (ZYRTEC ) 10 MG tablet Take 1 tablet (10 mg total) by mouth daily. 90 tablet 0   cyclobenzaprine  (FLEXERIL ) 5 MG tablet Take 1 tablet (5 mg total) by mouth 3 (three) times daily as needed for muscle spasms. 30 tablet 0   fluticasone  (FLONASE ) 50 MCG/ACT nasal spray SPRAY 2 SPRAYS INTO EACH NOSTRIL EVERY DAY (Patient not taking: Reported on 10/03/2023) 16 mL 0   ipratropium (ATROVENT ) 0.03 % nasal spray Place 2 sprays into both nostrils every 12 (twelve) hours. (Patient not taking: Reported on 10/03/2023) 30 mL 12   ondansetron  (ZOFRAN -ODT) 4 MG disintegrating tablet Take 1 tablet (4 mg total) by mouth every 8 (eight) hours as needed for nausea or vomiting. (Patient not taking: Reported on 10/03/2023) 20 tablet 0   No facility-administered medications prior to visit.    Family History  Problem Relation Age of  Onset   Uterine cancer Mother    Hypertension Father    Diabetes Maternal Grandmother    COPD Maternal Grandfather    Diabetes Paternal Grandmother   No noted fmhx bipolar disease.   Per HPI unless specifically indicated in ROS section below Review of Systems  Objective:  BP 132/82   Pulse 82   Temp 98 F (36.7 C) (Oral)   Ht 5' 8 (1.727 m)   Wt 276 lb (125.2 kg)   SpO2 96%   BMI 41.97 kg/m   Wt Readings from Last 3 Encounters:  10/03/23 276 lb (125.2 kg)  02/10/22 266 lb (120.7 kg)   01/23/22 262 lb 3.2 oz (118.9 kg)            Physical Exam Vitals and nursing note reviewed.  Constitutional:      Appearance: Normal appearance. She is not ill-appearing.  HENT:     Head: Normocephalic and atraumatic.     Mouth/Throat:     Mouth: Mucous membranes are moist.     Pharynx: Oropharynx is clear. No oropharyngeal exudate.  Eyes:     Extraocular Movements: Extraocular movements intact.     Pupils: Pupils are equal, round, and reactive to light.  Cardiovascular:     Rate and Rhythm: Normal rate and regular rhythm.     Pulses: Normal pulses.     Heart sounds: Normal heart sounds. No murmur heard. Pulmonary:     Effort: Pulmonary effort is normal. No respiratory distress.     Breath sounds: Normal breath sounds. No wheezing, rhonchi or rales.  Chest:     Chest wall: Tenderness present.       Comments: Reproducible tenderness to palpation of R 2nd costochondral junction with  mild tender soft tissue swelling lateral to this Abdominal:     Tenderness: There is no abdominal tenderness.  Musculoskeletal:     Right lower leg: No edema.     Left lower leg: No edema.  Skin:    General: Skin is warm and dry.     Findings: No rash.  Neurological:     Mental Status: She is alert.  Psychiatric:        Mood and Affect: Mood is anxious.        Behavior: Behavior normal.     Comments: Tearful with discussion of recent stressors       Results               10/03/2023   12:36 PM 01/24/2022    9:50 AM 01/23/2022    2:43 PM 08/05/2018   11:43 AM 06/03/2018   11:56 AM  Depression screen PHQ 2/9  Decreased Interest 0 0 0 0 0  Down, Depressed, Hopeless 0 0 0 0 0  PHQ - 2 Score 0 0 0 0 0  Altered sleeping 0 0 0 0   Tired, decreased energy 0 0 0 0   Change in appetite 0 0 0 0   Feeling bad or failure about yourself  0 0 0 0   Trouble concentrating 0 0 0 0   Moving slowly or fidgety/restless 0 0 0 0   Suicidal thoughts 0 0 0 0   PHQ-9 Score 0 0 0 0   Difficult doing  work/chores Not difficult at all  Not difficult at all Not difficult at all        10/03/2023   12:36 PM 02/10/2022    3:49 PM 01/24/2022    9:50 AM 01/23/2022  2:44 PM  GAD 7 : Generalized Anxiety Score  Nervous, Anxious, on Edge 3 0 0 0  Control/stop worrying 1 0 0 0  Worry too much - different things 3 0 0 0  Trouble relaxing 3 0 0 0  Restless 0 0 0 0  Easily annoyed or irritable 0 0 0 0  Afraid - awful might happen 0 0 0 0  Total GAD 7 Score 10 0 0 0  Anxiety Difficulty Extremely difficult Not difficult at all Not difficult at all Not difficult at all   Assessment & Plan:      Costochondritis of chest wall Reproducible tenderness at costochondral joint due to inflammation from strain. Not consistent with pulmonary cause like pneumonia, PE, or cardiac cause. Symptoms worsened by stress and activity. - Advise use of heating pad or ice, alternating as needed, covered in towel. - She will start otc ibuprofen  600 mg to 800 mg, two to three times daily with meals for five days, then as needed. Reviewed GI precautions with NSAID use.  - Recommend Voltaren gel applied to tender area three times daily. - Continue Flexeril  as needed for muscle relaxation. - Update if not improving with treatment, or any worsening symptoms.   Anxiety with panic attack Recent panic attack with chest tightness, heart racing, and weakness due to stress. Anxiety remotely managed with medication. No suicidal ideation or severe depression. Discussed Lexapro  side effects. - Prescribe Lexapro  10 mg once daily in the morning, full effect in three to four weeks. - Prescribe hydroxyzine , half to one pill twice daily as needed for acute anxiety. - Consider counseling referral.  - Recommend follow-up in three to four weeks to assess treatment response.      Problem List Items Addressed This Visit     Costochondritis, acute   Anxiety disorder with panic attacks - Primary   Relevant Medications   hydrOXYzine   (ATARAX ) 25 MG tablet   escitalopram  (LEXAPRO ) 10 MG tablet     Meds ordered this encounter  Medications   hydrOXYzine  (ATARAX ) 25 MG tablet    Sig: Take 0.5-1 tablets (12.5-25 mg total) by mouth 2 (two) times daily as needed for anxiety (sedation precautions).    Dispense:  30 tablet    Refill:  0   escitalopram  (LEXAPRO ) 10 MG tablet    Sig: Take 1 tablet (10 mg total) by mouth daily.    Dispense:  30 tablet    Refill:  3    No orders of the defined types were placed in this encounter.   Patient Instructions  VISIT SUMMARY: Today, you were seen for chest discomfort and a recent panic attack. You have been experiencing significant stress due to personal and family health issues, which has contributed to your symptoms. We discussed your current condition and created a plan to help manage your symptoms and improve your overall well-being.  YOUR PLAN: -COSTOCHONDRITIS OF CHEST WALL: Costochondritis is inflammation of the cartilage that connects a rib to the breastbone, causing chest pain. You should use a heating pad or ice on the affected area, covered with a towel, as needed. Take ibuprofen  600 mg to 800 mg two to three times daily with meals for five days, then as needed. Apply topical over the counter Voltaren gel to the tender area three times daily and continue using Flexeril  as needed for muscle relaxation.  -ANXIETY WITH PANIC ATTACK: Anxiety can cause panic attacks, which are sudden episodes of intense fear that trigger severe physical reactions. You  have been prescribed Lexapro  10 mg once daily in the morning, which will take about four weeks to reach full effect. For acute anxiety, take hydroxyzine , half to one pill twice daily as needed. The hydroxyzine  may make you sleepy. We will follow up in three to four weeks to assess how the treatment is working.   INSTRUCTIONS: Please follow up in four weeks to assess your response to the treatment for anxiety. If you have any concerns  or if your symptoms worsen, contact our office immediately.  Follow up plan: Return in about 1 month (around 11/02/2023) for follow up visit.  Anton Blas, MD

## 2023-10-11 ENCOUNTER — Other Ambulatory Visit: Payer: Self-pay | Admitting: Family Medicine

## 2023-10-11 NOTE — Telephone Encounter (Signed)
 Patient has toc in October ok to refill?

## 2023-10-11 NOTE — Telephone Encounter (Signed)
 ERx

## 2023-10-26 ENCOUNTER — Other Ambulatory Visit: Payer: Self-pay | Admitting: Family Medicine

## 2023-11-02 ENCOUNTER — Ambulatory Visit: Admitting: Family Medicine

## 2023-11-27 ENCOUNTER — Ambulatory Visit: Admitting: Family Medicine

## 2023-12-11 ENCOUNTER — Ambulatory Visit: Admitting: Family Medicine

## 2023-12-19 ENCOUNTER — Ambulatory Visit: Admitting: Family Medicine

## 2023-12-27 ENCOUNTER — Other Ambulatory Visit: Payer: Self-pay | Admitting: Family Medicine
